# Patient Record
Sex: Male | Born: 1960 | Race: White | Hispanic: No | Marital: Married | Smoking: Never smoker
Health system: Southern US, Community
[De-identification: ages and names within clinical notes are randomized; demographics above are authoritative.]

## PROBLEM LIST (undated history)

## (undated) DIAGNOSIS — T7840XA Allergy, unspecified, initial encounter: Secondary | ICD-10-CM

## (undated) DIAGNOSIS — Z789 Other specified health status: Secondary | ICD-10-CM

## (undated) DIAGNOSIS — K859 Acute pancreatitis without necrosis or infection, unspecified: Secondary | ICD-10-CM

## (undated) DIAGNOSIS — K5792 Diverticulitis of intestine, part unspecified, without perforation or abscess without bleeding: Secondary | ICD-10-CM

## (undated) DIAGNOSIS — K760 Fatty (change of) liver, not elsewhere classified: Secondary | ICD-10-CM

## (undated) DIAGNOSIS — Z923 Personal history of irradiation: Secondary | ICD-10-CM

## (undated) HISTORY — PX: COLONOSCOPY: SHX174

---

## 2008-07-24 ENCOUNTER — Encounter: Admission: RE | Admit: 2008-07-24 | Discharge: 2008-07-24 | Payer: Self-pay | Admitting: Gastroenterology

## 2013-09-24 ENCOUNTER — Ambulatory Visit
Admission: RE | Admit: 2013-09-24 | Discharge: 2013-09-24 | Disposition: A | Discharge status: Home / Self Care | Payer: BC Managed Care – PPO | Source: Ambulatory Visit | Attending: Family Medicine | Admitting: Family Medicine

## 2013-09-24 ENCOUNTER — Other Ambulatory Visit: Payer: Self-pay | Admitting: Family Medicine

## 2013-09-24 DIAGNOSIS — R109 Unspecified abdominal pain: Secondary | ICD-10-CM

## 2013-09-24 MED ORDER — IOHEXOL 300 MG/ML  SOLN
100.0000 mL | Freq: Once | INTRAMUSCULAR | Status: AC | PRN
  Administered 2013-09-24: 100 mL via INTRAVENOUS

## 2018-08-21 ENCOUNTER — Encounter (HOSPITAL_BASED_OUTPATIENT_CLINIC_OR_DEPARTMENT_OTHER): Payer: Self-pay | Admitting: *Deleted

## 2018-08-25 ENCOUNTER — Ambulatory Visit (HOSPITAL_BASED_OUTPATIENT_CLINIC_OR_DEPARTMENT_OTHER): Admission: RE | Admit: 2018-08-25 | Payer: BC Managed Care – PPO | Source: Ambulatory Visit | Admitting: Otolaryngology

## 2018-08-25 HISTORY — DX: Other specified health status: Z78.9

## 2018-08-25 SURGERY — EXCISION, PAROTID GLAND
Anesthesia: General

## 2018-11-27 ENCOUNTER — Ambulatory Visit: Payer: Self-pay | Admitting: Otolaryngology

## 2018-11-27 ENCOUNTER — Other Ambulatory Visit: Payer: Self-pay

## 2018-11-27 ENCOUNTER — Encounter (HOSPITAL_BASED_OUTPATIENT_CLINIC_OR_DEPARTMENT_OTHER): Payer: Self-pay | Admitting: *Deleted

## 2018-11-27 NOTE — H&P (Signed)
PREOPERATIVE H&P  Chief Complaint: left parotid mass  HPI: Christopher Pacheco is a 58 y.o. male who presents for evaluation of left parotid mass he has had for over a year. This measures approximately 3 cm in size and located just behind the angle of the mandible in the region of the tail of the left parotid gland.. He has no palpable adenopathy in the neck He has normal facial nerve function.  Past Medical History:  Diagnosis Date  . Medical history non-contributory    Past Surgical History:  Procedure Laterality Date  . COLONOSCOPY     Social History   Socioeconomic History  . Marital status: Married    Spouse name: Not on file  . Number of children: Not on file  . Years of education: Not on file  . Highest education level: Not on file  Occupational History  . Not on file  Social Needs  . Financial resource strain: Not on file  . Food insecurity:    Worry: Not on file    Inability: Not on file  . Transportation needs:    Medical: Not on file    Non-medical: Not on file  Tobacco Use  . Smoking status: Never Smoker  . Smokeless tobacco: Never Used  Substance and Sexual Activity  . Alcohol use: Yes    Alcohol/week: 1.0 standard drinks    Types: 1 Glasses of wine per week    Comment: weekends  . Drug use: Never  . Sexual activity: Not on file  Lifestyle  . Physical activity:    Days per week: Not on file    Minutes per session: Not on file  . Stress: Not on file  Relationships  . Social connections:    Talks on phone: Not on file    Gets together: Not on file    Attends religious service: Not on file    Active member of club or organization: Not on file    Attends meetings of clubs or organizations: Not on file    Relationship status: Not on file  Other Topics Concern  . Not on file  Social History Narrative  . Not on file   History reviewed. No pertinent family history. No Known Allergies Prior to Admission medications   Medication Sig Start Date End Date  Taking? Authorizing Provider  ibuprofen (ADVIL,MOTRIN) 200 MG tablet Take 200 mg by mouth every 6 (six) hours as needed.    [provider]     Positive ROS: negative  All other systems have been reviewed and were otherwise negative with the exception of those mentioned in the HPI and as above.  Physical Exam: There were no vitals filed for this visit.  General: Alert, no acute distress Oral: Normal oral mucosa and tonsils Nasal: Clear nasal passages Neck: No palpable adenopathy or thyroid nodules. 3 cm mass just behind the angle of the mandible on the left side. Ear: Ear canal is clear with normal appearing TMs Cardiovascular: Regular rate and rhythm, no murmur.  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   Melony Overly, MD 11/27/2018 10:55 AM

## 2018-12-01 ENCOUNTER — Other Ambulatory Visit: Payer: Self-pay

## 2018-12-01 ENCOUNTER — Ambulatory Visit (HOSPITAL_BASED_OUTPATIENT_CLINIC_OR_DEPARTMENT_OTHER)
Admission: RE | Admit: 2018-12-01 | Discharge: 2018-12-02 | Disposition: A | Discharge status: Home / Self Care | Payer: BC Managed Care – PPO | Attending: Otolaryngology | Admitting: Otolaryngology

## 2018-12-01 ENCOUNTER — Ambulatory Visit (HOSPITAL_BASED_OUTPATIENT_CLINIC_OR_DEPARTMENT_OTHER): Payer: BC Managed Care – PPO | Admitting: Anesthesiology

## 2018-12-01 ENCOUNTER — Encounter (HOSPITAL_BASED_OUTPATIENT_CLINIC_OR_DEPARTMENT_OTHER): Payer: Self-pay | Admitting: Anesthesiology

## 2018-12-01 ENCOUNTER — Encounter (HOSPITAL_BASED_OUTPATIENT_CLINIC_OR_DEPARTMENT_OTHER)
Admission: RE | Disposition: A | Discharge status: Home / Self Care | Payer: Self-pay | Source: Home / Self Care | Attending: Otolaryngology

## 2018-12-01 DIAGNOSIS — K118 Other diseases of salivary glands: Secondary | ICD-10-CM | POA: Diagnosis present

## 2018-12-01 DIAGNOSIS — C07 Malignant neoplasm of parotid gland: Secondary | ICD-10-CM | POA: Insufficient documentation

## 2018-12-01 DIAGNOSIS — D490 Neoplasm of unspecified behavior of digestive system: Secondary | ICD-10-CM | POA: Diagnosis present

## 2018-12-01 HISTORY — PX: PAROTIDECTOMY: SHX2163

## 2018-12-01 SURGERY — EXCISION, PAROTID GLAND
Anesthesia: General | Site: Neck | Laterality: Left

## 2018-12-01 MED ORDER — FENTANYL CITRATE (PF) 100 MCG/2ML IJ SOLN
INTRAMUSCULAR | Status: AC
  Filled 2018-12-01: qty 2

## 2018-12-01 MED ORDER — LACTATED RINGERS IV SOLN
INTRAVENOUS | Status: DC
  Administered 2018-12-01 (×3): via INTRAVENOUS

## 2018-12-01 MED ORDER — IBUPROFEN 100 MG/5ML PO SUSP
ORAL | Status: AC
  Filled 2018-12-01: qty 20

## 2018-12-01 MED ORDER — CEFAZOLIN SODIUM-DEXTROSE 1-4 GM/50ML-% IV SOLN
1.0000 g | Freq: Three times a day (TID) | INTRAVENOUS | Status: DC
  Administered 2018-12-01 – 2018-12-02 (×3): 1 g via INTRAVENOUS
  Filled 2018-12-01 (×3): qty 50

## 2018-12-01 MED ORDER — ONDANSETRON HCL 4 MG PO TABS: 4.0000 mg | ORAL_TABLET | ORAL | Status: DC | PRN

## 2018-12-01 MED ORDER — POTASSIUM CHLORIDE IN NACL 20-0.9 MEQ/L-% IV SOLN
INTRAVENOUS | Status: DC
  Administered 2018-12-01: 13:00:00 via INTRAVENOUS
  Filled 2018-12-01 (×2): qty 1000

## 2018-12-01 MED ORDER — CHLORHEXIDINE GLUCONATE CLOTH 2 % EX PADS: 6.0000 | MEDICATED_PAD | Freq: Once | CUTANEOUS | Status: DC

## 2018-12-01 MED ORDER — DEXAMETHASONE SODIUM PHOSPHATE 4 MG/ML IJ SOLN
INTRAMUSCULAR | Status: DC | PRN
  Administered 2018-12-01: 10 mg via INTRAVENOUS

## 2018-12-01 MED ORDER — DEXAMETHASONE SODIUM PHOSPHATE 10 MG/ML IJ SOLN
INTRAMUSCULAR | Status: AC
  Filled 2018-12-01: qty 1

## 2018-12-01 MED ORDER — ONDANSETRON HCL 4 MG/2ML IJ SOLN: 4.0000 mg | INTRAMUSCULAR | Status: DC | PRN

## 2018-12-01 MED ORDER — CEFAZOLIN SODIUM-DEXTROSE 2-4 GM/100ML-% IV SOLN
INTRAVENOUS | Status: AC
  Filled 2018-12-01: qty 100

## 2018-12-01 MED ORDER — BACITRACIN 500 UNIT/GM EX OINT
TOPICAL_OINTMENT | CUTANEOUS | Status: DC | PRN
  Administered 2018-12-01: 1 via TOPICAL

## 2018-12-01 MED ORDER — LIDOCAINE-EPINEPHRINE 1 %-1:100000 IJ SOLN
INTRAMUSCULAR | Status: AC
  Filled 2018-12-01: qty 1

## 2018-12-01 MED ORDER — SUCCINYLCHOLINE CHLORIDE 20 MG/ML IJ SOLN
INTRAMUSCULAR | Status: DC | PRN
Start: 2018-12-01 — End: 2018-12-01
  Administered 2018-12-01: 140 mg via INTRAVENOUS

## 2018-12-01 MED ORDER — DEXAMETHASONE SODIUM PHOSPHATE 10 MG/ML IJ SOLN
10.0000 mg | Freq: Once | INTRAMUSCULAR | Status: AC
  Administered 2018-12-01: 10 mg via INTRAVENOUS

## 2018-12-01 MED ORDER — PROPOFOL 500 MG/50ML IV EMUL
INTRAVENOUS | Status: AC
  Filled 2018-12-01: qty 50

## 2018-12-01 MED ORDER — MIDAZOLAM HCL 2 MG/2ML IJ SOLN
INTRAMUSCULAR | Status: AC
  Filled 2018-12-01: qty 2

## 2018-12-01 MED ORDER — LIDOCAINE-EPINEPHRINE 1 %-1:100000 IJ SOLN
INTRAMUSCULAR | Status: DC | PRN
  Administered 2018-12-01: 3 mL

## 2018-12-01 MED ORDER — SCOPOLAMINE 1 MG/3DAYS TD PT72: 1.0000 | MEDICATED_PATCH | Freq: Once | TRANSDERMAL | Status: DC | PRN

## 2018-12-01 MED ORDER — EPINEPHRINE 30 MG/30ML IJ SOLN
INTRAMUSCULAR | Status: AC
  Filled 2018-12-01: qty 1

## 2018-12-01 MED ORDER — LIDOCAINE 2% (20 MG/ML) 5 ML SYRINGE
INTRAMUSCULAR | Status: DC | PRN
  Administered 2018-12-01: 100 mg via INTRAVENOUS

## 2018-12-01 MED ORDER — MIDAZOLAM HCL 2 MG/2ML IJ SOLN
1.0000 mg | INTRAMUSCULAR | Status: DC | PRN
  Administered 2018-12-01: 2 mg via INTRAVENOUS

## 2018-12-01 MED ORDER — IBUPROFEN 100 MG/5ML PO SUSP
400.0000 mg | Freq: Four times a day (QID) | ORAL | Status: DC | PRN
  Administered 2018-12-01 (×2): 400 mg via ORAL

## 2018-12-01 MED ORDER — MORPHINE SULFATE (PF) 4 MG/ML IV SOLN: 2.0000 mg | INTRAVENOUS | Status: DC | PRN

## 2018-12-01 MED ORDER — EPHEDRINE SULFATE 50 MG/ML IJ SOLN
INTRAMUSCULAR | Status: DC | PRN
  Administered 2018-12-01 (×5): 5 mg via INTRAVENOUS

## 2018-12-01 MED ORDER — FENTANYL CITRATE (PF) 100 MCG/2ML IJ SOLN
50.0000 ug | INTRAMUSCULAR | Status: DC | PRN
  Administered 2018-12-01: 100 ug via INTRAVENOUS
  Administered 2018-12-01: 50 ug via INTRAVENOUS

## 2018-12-01 MED ORDER — PROPOFOL 10 MG/ML IV BOLUS
INTRAVENOUS | Status: DC | PRN
Start: 2018-12-01 — End: 2018-12-01
  Administered 2018-12-01: 160 mg via INTRAVENOUS
  Administered 2018-12-01: 20 mg via INTRAVENOUS

## 2018-12-01 MED ORDER — ONDANSETRON HCL 4 MG/2ML IJ SOLN
INTRAMUSCULAR | Status: AC
  Filled 2018-12-01: qty 2

## 2018-12-01 MED ORDER — LIDOCAINE 2% (20 MG/ML) 5 ML SYRINGE
INTRAMUSCULAR | Status: AC
  Filled 2018-12-01: qty 5

## 2018-12-01 MED ORDER — ONDANSETRON HCL 4 MG/2ML IJ SOLN
INTRAMUSCULAR | Status: DC | PRN
  Administered 2018-12-01: 4 mg via INTRAVENOUS

## 2018-12-01 MED ORDER — FENTANYL CITRATE (PF) 100 MCG/2ML IJ SOLN: 25.0000 ug | INTRAMUSCULAR | Status: DC | PRN

## 2018-12-01 MED ORDER — HYDROCODONE-ACETAMINOPHEN 5-325 MG PO TABS
1.0000 | ORAL_TABLET | ORAL | Status: DC | PRN
  Administered 2018-12-01 (×2): 2 via ORAL
  Filled 2018-12-01 (×2): qty 2

## 2018-12-01 MED ORDER — BACITRACIN ZINC 500 UNIT/GM EX OINT
TOPICAL_OINTMENT | CUTANEOUS | Status: AC
  Filled 2018-12-01: qty 0.9

## 2018-12-01 MED ORDER — CEFAZOLIN SODIUM-DEXTROSE 2-4 GM/100ML-% IV SOLN
2.0000 g | INTRAVENOUS | Status: AC
  Administered 2018-12-01: 2 g via INTRAVENOUS

## 2018-12-01 SURGICAL SUPPLY — 78 items
APPLICATOR COTTON TIP 6 STRL (MISCELLANEOUS) ×1 IMPLANT
APPLICATOR COTTON TIP 6IN STRL (MISCELLANEOUS) ×3
ATTRACTOMAT 16X20 MAGNETIC DRP (DRAPES) ×3 IMPLANT
BENZOIN TINCTURE PRP APPL 2/3 (GAUZE/BANDAGES/DRESSINGS) IMPLANT
BLADE SURG 12 STRL SS (BLADE) ×3 IMPLANT
BLADE SURG 15 STRL LF DISP TIS (BLADE) ×1 IMPLANT
BLADE SURG 15 STRL SS (BLADE) ×2
CANISTER SUCT 1200ML W/VALVE (MISCELLANEOUS) ×3 IMPLANT
CLEANER CAUTERY TIP 5X5 PAD (MISCELLANEOUS) IMPLANT
CLOSURE WOUND 1/2 X4 (GAUZE/BANDAGES/DRESSINGS)
CORD BIPOLAR FORCEPS 12FT (ELECTRODE) ×3 IMPLANT
COTTONBALL LRG STERILE PKG (GAUZE/BANDAGES/DRESSINGS) ×3 IMPLANT
COVER BACK TABLE 60X90IN (DRAPES) ×3 IMPLANT
COVER MAYO STAND STRL (DRAPES) ×3 IMPLANT
COVER WAND RF STERILE (DRAPES) IMPLANT
DECANTER SPIKE VIAL GLASS SM (MISCELLANEOUS) ×3 IMPLANT
DERMABOND ADVANCED (GAUZE/BANDAGES/DRESSINGS)
DERMABOND ADVANCED .7 DNX12 (GAUZE/BANDAGES/DRESSINGS) IMPLANT
DRAIN JP 10F RND SILICONE (MISCELLANEOUS) ×2 IMPLANT
DRAIN PENROSE 1/4X12 LTX STRL (WOUND CARE) IMPLANT
DRAPE SURG 17X23 STRL (DRAPES) ×3 IMPLANT
DRAPE U-SHAPE 76X120 STRL (DRAPES) ×3 IMPLANT
ELECT COATED BLADE 2.86 ST (ELECTRODE) ×3 IMPLANT
ELECT REM PT RETURN 9FT ADLT (ELECTROSURGICAL) ×3
ELECTRODE REM PT RTRN 9FT ADLT (ELECTROSURGICAL) ×1 IMPLANT
EVACUATOR SILICONE 100CC (DRAIN) ×2 IMPLANT
FORCEPS BIPOLAR SPETZLER 8 1.0 (NEUROSURGERY SUPPLIES) ×3 IMPLANT
GAUZE 4X4 16PLY RFD (DISPOSABLE) IMPLANT
GAUZE SPONGE 4X4 12PLY STRL LF (GAUZE/BANDAGES/DRESSINGS) IMPLANT
GLOVE BIO SURGEON STRL SZ 6.5 (GLOVE) ×2 IMPLANT
GLOVE BIO SURGEONS STRL SZ 6.5 (GLOVE) ×2
GLOVE BIOGEL PI IND STRL 7.0 (GLOVE) IMPLANT
GLOVE BIOGEL PI INDICATOR 7.0 (GLOVE) ×6
GLOVE SS BIOGEL STRL SZ 7.5 (GLOVE) ×1 IMPLANT
GLOVE SUPERSENSE BIOGEL SZ 7.5 (GLOVE) ×2
GLOVE SURG SS PI 7.5 STRL IVOR (GLOVE) IMPLANT
GOWN STRL REUS W/ TWL LRG LVL3 (GOWN DISPOSABLE) ×1 IMPLANT
GOWN STRL REUS W/ TWL XL LVL3 (GOWN DISPOSABLE) ×1 IMPLANT
GOWN STRL REUS W/TWL LRG LVL3 (GOWN DISPOSABLE) ×4
GOWN STRL REUS W/TWL XL LVL3 (GOWN DISPOSABLE) ×4
LOCATOR NERVE 3 VOLT (DISPOSABLE) IMPLANT
NDL HYPO 25X1 1.5 SAFETY (NEEDLE) ×1 IMPLANT
NEEDLE HYPO 25X1 1.5 SAFETY (NEEDLE) ×3 IMPLANT
NS IRRIG 1000ML POUR BTL (IV SOLUTION) ×3 IMPLANT
PACK BASIN DAY SURGERY FS (CUSTOM PROCEDURE TRAY) ×3 IMPLANT
PAD CLEANER CAUTERY TIP 5X5 (MISCELLANEOUS) ×2
PENCIL BUTTON HOLSTER BLD 10FT (ELECTRODE) ×3 IMPLANT
PIN SAFETY STERILE (MISCELLANEOUS) IMPLANT
SHEET MEDIUM DRAPE 40X70 STRL (DRAPES) ×2 IMPLANT
SLEEVE SCD COMPRESS KNEE MED (MISCELLANEOUS) ×3 IMPLANT
SPONGE INTESTINAL PEANUT (DISPOSABLE) IMPLANT
STAPLER VISISTAT 35W (STAPLE) IMPLANT
STRIP CLOSURE SKIN 1/2X4 (GAUZE/BANDAGES/DRESSINGS) IMPLANT
SUCTION FRAZIER HANDLE 10FR (MISCELLANEOUS)
SUCTION TUBE FRAZIER 10FR DISP (MISCELLANEOUS) IMPLANT
SUT CHROMIC 3 0 PS 2 (SUTURE) ×3 IMPLANT
SUT ETHILON 3 0 PS 1 (SUTURE) ×3 IMPLANT
SUT ETHILON 4 0 PS 2 18 (SUTURE) IMPLANT
SUT ETHILON 5 0 P 3 18 (SUTURE) ×2
SUT ETHILON 6 0 P 1 (SUTURE) IMPLANT
SUT NYLON ETHILON 5-0 P-3 1X18 (SUTURE) IMPLANT
SUT SILK 2 0 PERMA HAND 18 BK (SUTURE) ×3 IMPLANT
SUT SILK 2 0 SH (SUTURE) ×2 IMPLANT
SUT SILK 2 0 TIES 17X18 (SUTURE) ×2
SUT SILK 2-0 18XBRD TIE BLK (SUTURE) IMPLANT
SUT SILK 3 0 PS 1 (SUTURE) IMPLANT
SUT SILK 3 0 SH 30 (SUTURE) IMPLANT
SUT SILK 3 0 TIES 17X18 (SUTURE) ×2
SUT SILK 3-0 18XBRD TIE BLK (SUTURE) ×1 IMPLANT
SUT SILK 4 0 TIES 17X18 (SUTURE) ×3 IMPLANT
SWAB COLLECTION DEVICE MRSA (MISCELLANEOUS) IMPLANT
SWAB CULTURE ESWAB REG 1ML (MISCELLANEOUS) IMPLANT
SYR BULB 3OZ (MISCELLANEOUS) ×3 IMPLANT
SYR CONTROL 10ML LL (SYRINGE) ×3 IMPLANT
TOWEL GREEN STERILE FF (TOWEL DISPOSABLE) ×6 IMPLANT
TRAY DSU PREP LF (CUSTOM PROCEDURE TRAY) ×3 IMPLANT
TUBE CONNECTING 20'X1/4 (TUBING) ×1
TUBE CONNECTING 20X1/4 (TUBING) ×2 IMPLANT

## 2018-12-01 NOTE — Anesthesia Procedure Notes (Signed)
Procedure Name: Intubation Date/Time: 12/01/2018 7:47 AM Performed by: Maryella Shivers, CRNA Pre-anesthesia Checklist: Patient identified, Emergency Drugs available, Suction available and Patient being monitored Patient Re-evaluated:Patient Re-evaluated prior to induction Oxygen Delivery Method: Circle system utilized Preoxygenation: Pre-oxygenation with 100% oxygen Induction Type: IV induction Ventilation: Mask ventilation without difficulty Laryngoscope Size: Mac and 4 Tube type: Oral Tube size: 8.0 mm Number of attempts: 1 Airway Equipment and Method: Stylet and Oral airway Placement Confirmation: ETT inserted through vocal cords under direct vision,  positive ETCO2 and breath sounds checked- equal and bilateral Secured at: 22 cm Tube secured with: Tape Dental Injury: Teeth and Oropharynx as per pre-operative assessment

## 2018-12-01 NOTE — Anesthesia Postprocedure Evaluation (Signed)
Anesthesia Post Note  Patient: Christopher Pacheco  Procedure(s) Performed: PAROTIDECTOMY (Left Neck)     Patient location during evaluation: PACU Anesthesia Type: General Level of consciousness: awake Pain management: pain level controlled Vital Signs Assessment: post-procedure vital signs reviewed and stable Respiratory status: spontaneous breathing Cardiovascular status: stable Postop Assessment: no apparent nausea or vomiting Anesthetic complications: no    Last Vitals:  Vitals:   12/01/18 1212 12/01/18 1215  BP: 114/74 117/70  Pulse: (!) 110 (!) 109  Resp: 18 19  Temp: 36.5 C   SpO2: 100% 100%    Last Pain:  Vitals:   12/01/18 1212  TempSrc:   PainSc: 0-No pain                 Samon Dishner

## 2018-12-01 NOTE — Anesthesia Preprocedure Evaluation (Signed)
Anesthesia Evaluation  Patient identified by MRN, date of birth, ID band Patient awake  General Assessment Comment:History noted. CG  Reviewed: Allergy & Precautions, NPO status , Patient's Chart, lab work & pertinent test results  Airway Mallampati: II  TM Distance: >3 FB     Dental   Pulmonary neg pulmonary ROS,    breath sounds clear to auscultation       Cardiovascular negative cardio ROS   Rhythm:Regular Rate:Normal     Neuro/Psych    GI/Hepatic negative GI ROS, Neg liver ROS,   Endo/Other  negative endocrine ROS  Renal/GU negative Renal ROS     Musculoskeletal   Abdominal   Peds  Hematology   Anesthesia Other Findings   Reproductive/Obstetrics                             Anesthesia Physical Anesthesia Plan  ASA: II  Anesthesia Plan: General   Post-op Pain Management:    Induction: Intravenous  PONV Risk Score and Plan: Ondansetron, Dexamethasone and Midazolam  Airway Management Planned: Oral ETT  Additional Equipment:   Intra-op Plan:   Post-operative Plan: Extubation in OR  Informed Consent: I have reviewed the patients History and Physical, chart, labs and discussed the procedure including the risks, benefits and alternatives for the proposed anesthesia with the patient or authorized representative who has indicated his/her understanding and acceptance.     Dental advisory given  Plan Discussed with: CRNA and Anesthesiologist  Anesthesia Plan Comments:         Anesthesia Quick Evaluation

## 2018-12-01 NOTE — Op Note (Signed)
NAME: Christopher Pacheco, Christopher Pacheco MEDICAL RECORD [id number] ACCOUNT [id number] DATE OF BIRTH:1961-08-11 FACILITY: [facility] LOCATION: [facility] PHYSICIAN:CHRISTOPHER Lincoln Maxin, MD  OPERATIVE REPORT  DATE OF PROCEDURE:  12/01/2018  PREOPERATIVE DIAGNOSIS:  Left parotid tumor.  POSTOPERATIVE DIAGNOSIS:  Left parotid tumor.  OPERATION PERFORMED:  Left parotidectomy with facial nerve dissection with removal of a deep lobe left parotid tumor.  SURGEON:  Melony Overly, MD  ANESTHESIA:  General endotracheal.  ESTIMATED BLOOD LOSS:  50 mL.  COMPLICATIONS:  None.  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  It has not been bothersome to him.  He has normal facial nerve function.  He is taken to the operating room at this time for  left superficial parotidectomy with facial nerve dissection and removal of left parotid mass.  Preoperatively, the mass is located just inferior to the angle of the mandible on the left side.  DESCRIPTION OF PROCEDURE:  After adequate endotracheal anesthesia, the patient received 2 grams Ancef IV preoperatively.  Left face was prepped with Betadine solution and draped out with sterile towels.  A standard parotid incision was made.   Subplatysmal flaps were elevated tissue was elevated off the parotid fascia anteriorly and posteriorly.  Elevation was down to the sternocleidomastoid muscle.  The mass on palpation was a little bit anterior and the extended up onto the mandible  anteriorly inferiorly.  It was smooth to palpation and fairly soft, but was deep.  Dissection was carried down next to the cartilage of the ear down to where the facial nerve trunk was identified in its normal anatomical position just inferior to the  styloid process.  The trunk was discovered, dissected out.  Being careful not to preserve all the branches.  The mass was anterior and inferior and only the lower main  division of the facial nerve was followed after it divided.  The lower branches of the  facial nerve was then dissected out and the mass was actually just deep to the lower branches of the facial nerve.  There were a couple of large veins, a branch of the facial vein that were ligated with 4-0 silk sutures and divided.  The mass extended  very anterior on the parotid gland and just behind the  mass which was deep to the parotid gland.  It extended what appeared to in the direction of the parotid duct anteriorly.  took meticulous dissection now below the lower branches of the facial nerve  to dissect the tumor out.  It was encapsulated, but part of the capsule was kind of adherent to the surrounding tissue requiring division with bipolar cautery.  Several ligatures were used for veins that fed the mass.  At the very distal end, where it  appeared to be adjacent to the parotid duct.  The entire firm mass was removed and a clamp was placed around distal to this along with what I think was the parotid duct and this was ligated with a 2-0 silk suture.  The mass was dissected off of the lower  branches of the facial nerve and off of the masseter muscle.  Hemostasis was obtained with bipolar cautery.  Mass was sent to pathology.  The lower branches of the facial nerve were preserved.  After obtaining adequate hemostasis, a 10 French parotid  drain was brought out behind the ear.  Wound was irrigated with saline.  The wound was then closed  with 3-0 chromic suture subcutaneously and 5-0 nylon to reapproximate the skin edges.  Mupirocin ointment and a dressing was applied.  The patient was  awoken from anesthesia and transferred to recovery room  postoperatively doing well.  DISPOSITION:  The patient will be observed overnight in the recovery care center.  I plan on removing the JP drain and discharging the patient in the morning.  AN/NUANCE  D:12/01/2018 T:12/01/2018 JOB:[id number]

## 2018-12-01 NOTE — Brief Op Note (Signed)
12/01/2018  12:02 PM  PATIENT:  Christopher Pacheco  58 y.o. male  PRE-OPERATIVE DIAGNOSIS:  NEOPLASMA UNCERTAIN BEHAVIOR OF PAROTID GLAND  POST-OPERATIVE DIAGNOSIS:  NEOPLASMA UNCERTAIN BEHAVIOR OF PAROTID  LEFT  PROCEDURE:  Procedure(s): PAROTIDECTOMY (Left)  SURGEON:  Surgeon(s) and Role:    [name], MD - Primary  PHYSICIAN ASSISTANT:   ASSISTANTS: none   ANESTHESIA:   general  EBL:  40 mL   BLOOD ADMINISTERED:none  DRAINS: (10 Fr) Jackson-Pratt drain(s) with closed bulb suction in the left parotid   LOCAL MEDICATIONS USED:  XYLOCAINE   SPECIMEN:  Source of Specimen:  left parotid gland  DISPOSITION OF SPECIMEN:  PATHOLOGY  COUNTS:  YES  TOURNIQUET:  * No tourniquets in log *  DICTATION: .Other Dictation: Dictation Number [id number]  PLAN OF CARE: Admit for overnight observation  PATIENT DISPOSITION:  PACU - hemodynamically stable.   Delay start of Pharmacological VTE agent (>24hrs) due to surgical blood loss or risk of bleeding: yes

## 2018-12-01 NOTE — Transfer of Care (Signed)
Immediate Anesthesia Transfer of Care Note  Patient: Christopher Pacheco  Procedure(s) Performed: PAROTIDECTOMY (Left Neck)  Patient Location: PACU  Anesthesia Type:General  Level of Consciousness: sedated  Airway & Oxygen Therapy: Patient Spontanous Breathing and Patient connected to face mask oxygen  Post-op Assessment: Report given to RN and Post -op Vital signs reviewed and stable  Post vital signs: Reviewed and stable  Last Vitals:  Vitals Value Taken Time  BP 117/70 12/01/2018 12:15 PM  Temp    Pulse 109 12/01/2018 12:15 PM  Resp 19 12/01/2018 12:15 PM  SpO2 100 % 12/01/2018 12:15 PM  Vitals shown include unvalidated device data.  Last Pain:  Vitals:   12/01/18 0642  TempSrc: Oral  PainSc: 0-No pain      Patients Stated Pain Goal: 0 (14/43/15 4008)  Complications: No apparent anesthesia complications

## 2018-12-01 NOTE — Progress Notes (Signed)
Post op check Moderate facial weakness Patient is able to close eye. No significant swelling JP with serosanguinous output. Stable post op check with moderate facial nerve weakness. Gave 10 mg decadron IV Will plan D/C in am.

## 2018-12-01 NOTE — Interval H&P Note (Signed)
History and Physical Interval Note:  12/01/2018 7:43 AM  Christopher Pacheco  has presented today for surgery, with the diagnosis of NEOPLASMA UNCERTAIN BEHAVIOR OF PAROTID GLAND,CHRONIC RHINITIS,DEVIATED SEPTUM  The various methods of treatment have been discussed with the patient and family. After consideration of risks, benefits and other options for treatment, the patient has consented to  Procedure(s): PAROTIDECTOMY (Left) as a surgical intervention .  The patient's history has been reviewed, patient examined, no change in status, stable for surgery.  I have reviewed the patient's chart and labs.  Questions were answered to the patient's satisfaction.     Melony Overly

## 2018-12-02 DIAGNOSIS — C07 Malignant neoplasm of parotid gland: Secondary | ICD-10-CM | POA: Diagnosis not present

## 2018-12-02 MED ORDER — HYDROCODONE-ACETAMINOPHEN 5-325 MG PO TABS: 1.0000 | ORAL_TABLET | Freq: Four times a day (QID) | ORAL | 0 refills | Status: DC | PRN

## 2018-12-02 NOTE — Discharge Instructions (Signed)
May get incision site wet in 24 hrs. Apply antibiotic ointment to incision site daily. Tylenol, ibuprofen or hydrocodone tabs 1-2 every 6 hrs prn pain Return to Dr Pollie Friar office next Thursday at 4:30 to have sutures removed and review path report. Call office if any problems or questions    [phone]

## 2018-12-02 NOTE — Progress Notes (Signed)
POD 1 AF VSS Minimal complaints of pain Marked facial nerve weakness but has some mobility and able to close eye Minimal facial swelling JP with serosanguinous output was removed and excision site was redressed and antibiotic ointment applied Path pending Patient discharged home and will follow up in 5 days Meds: home meds and hydrocodone 5 mg prn

## 2018-12-02 NOTE — Discharge Summary (Signed)
NAME: Christopher Pacheco, Christopher Pacheco MEDICAL RECORD [id number] ACCOUNT [id number] DATE OF BIRTH:1961-05-11 FACILITY: [facility] LOCATION: [facility] PHYSICIAN:CHRISTOPHER Lincoln Maxin, MD  DISCHARGE SUMMARY  DATE OF DISCHARGE:  12/02/2018  DIAGNOSIS:  Left parotid tumor.  OPERATIONS DURING THIS HOSPITALIZATION:  Left parotidectomy with facial nerve dissection on 12/01/2018.  HOSPITAL COURSE:  The patient was admitted via the operating room on 12/01/2018 at which time he underwent a left parotidectomy with facial nerve dissection.  The patient had a tumor or mass deep to the left facial nerve within the deep lobe of the  parotid gland that extended far anteriorly.  The nerve was preserved throughout the dissection, and the mass was sent to pathology, which is still pending.  Postoperatively, patient received perioperative Ancef and also had a JP drain in.   Postoperatively, he had marked facial weakness on the left side but was having some facial motion and was able to close the eye.  JP drain was removed on the first postoperative day.  The wound was doing well with minimal swelling.  The wound was  redressed, and the patient was discharged home on postop day #1.  He was instructed to take Tylenol, ibuprofen or hydrocodone p.r.n. pain.  He will follow up in my office in 5 days to have the sutures removed.  LN/NUANCE D:12/02/2018 T:12/02/2018 JOB:[id number]

## 2018-12-04 ENCOUNTER — Encounter (HOSPITAL_BASED_OUTPATIENT_CLINIC_OR_DEPARTMENT_OTHER): Payer: Self-pay | Admitting: Otolaryngology

## 2018-12-18 ENCOUNTER — Other Ambulatory Visit (HOSPITAL_COMMUNITY): Payer: Self-pay | Admitting: Otolaryngology

## 2018-12-18 DIAGNOSIS — C07 Malignant neoplasm of parotid gland: Secondary | ICD-10-CM

## 2018-12-20 ENCOUNTER — Other Ambulatory Visit (HOSPITAL_COMMUNITY): Payer: Self-pay | Admitting: Otolaryngology

## 2018-12-20 DIAGNOSIS — C07 Malignant neoplasm of parotid gland: Secondary | ICD-10-CM

## 2018-12-21 ENCOUNTER — Ambulatory Visit (HOSPITAL_COMMUNITY): Admission: RE | Admit: 2018-12-21 | Payer: BC Managed Care – PPO | Source: Ambulatory Visit

## 2018-12-21 ENCOUNTER — Encounter (HOSPITAL_COMMUNITY): Payer: Self-pay

## 2018-12-21 ENCOUNTER — Ambulatory Visit (HOSPITAL_COMMUNITY)
Admission: RE | Admit: 2018-12-21 | Discharge: 2018-12-21 | Disposition: A | Discharge status: Home / Self Care | Payer: BC Managed Care – PPO | Source: Ambulatory Visit | Attending: Otolaryngology | Admitting: Otolaryngology

## 2018-12-21 DIAGNOSIS — C07 Malignant neoplasm of parotid gland: Secondary | ICD-10-CM | POA: Diagnosis not present

## 2018-12-21 MED ORDER — IOHEXOL 300 MG/ML  SOLN
100.0000 mL | Freq: Once | INTRAMUSCULAR | Status: AC | PRN
  Administered 2018-12-21: 100 mL via INTRAVENOUS

## 2018-12-21 MED ORDER — SODIUM CHLORIDE (PF) 0.9 % IJ SOLN
INTRAMUSCULAR | Status: AC
  Filled 2018-12-21: qty 50

## 2018-12-22 ENCOUNTER — Other Ambulatory Visit (HOSPITAL_COMMUNITY): Payer: BC Managed Care – PPO

## 2018-12-22 ENCOUNTER — Ambulatory Visit (HOSPITAL_COMMUNITY): Admission: RE | Admit: 2018-12-22 | Payer: BC Managed Care – PPO | Source: Ambulatory Visit

## 2018-12-29 ENCOUNTER — Telehealth: Payer: Self-pay | Admitting: *Deleted

## 2018-12-29 NOTE — Telephone Encounter (Signed)
Oncology Nurse Navigator Documentation  Placed navigation introductory call, LVMM requesting call-back.  Gayleen Orem, RN, BSN Head & Neck Oncology Nurse [place] at [place] [phone]

## 2018-12-29 NOTE — Telephone Encounter (Signed)
Oncology Nurse Navigator Documentation  Rec'd call-back from new referral patient Christopher Pacheco.  Introduced myself as the H&N oncology nurse navigator that works with Dr. Isidore Moos to whom he has been referred by Dr. [name].   He confirmed understanding of referral and appt date/time of 2/25 8:30 NE/9:00 consult with Dr. Isidore Moos.  Briefly explained my role as his navigator, indicated I would be joining him during appt next week.  Confirmed understanding of [place] location, explained arrival and RadOnc registration process.  I explained the purpose of a dental evaluation prior to starting RT, indicated he wd be contacted by Dental Medicine  to arrange an appt within a day or so of his appt with Dr. Isidore Moos.   Answered his questions re: RT.  He indicated he and wife have planned and early April trip to [place].  I explained importance of not delaying start of tmt and proceeding with interruption.  Provided my contact information, encouraged him/her to call with questions/concerns before next week.  He/she verbalized understanding of information provided, expressed appreciation for my call.  Navigator Needs Assessment . Employment status:  Teaches at [employer], has flexible hours. . Support system: Wife . Transportation:  Own car. Marland Kitchen PCP:  Dr. Lona Kettle, sees him every 1-2 years.  Indicated he is overall very healthy, non-smoker, drinks a couple of glasses of wine over the weekend.  Eats a plant-based diet, swims regularly. Marland Kitchen PCD:  Dr. Vivia Ewing who just retired, expects to continue with her partner.  No dentures/partials.  Wisdom teeth extracted years ago.  Has twice-yearly check-ups, cleanings.  Rates dental health good to excellent.    Gayleen Orem, RN, BSN Head & Neck Oncology Nurse [place] at [place] [phone]

## 2019-01-02 NOTE — Progress Notes (Signed)
Head and Neck Cancer Location of Tumor / Histology:  12/01/18 Diagnosis Parotid gland, Left - POLYMORPHOUS ADENOCARCINOMA, 3.2 CM. - MARGINS OF RESECTION ARE NOT INVOLVED. - ONE LYMPH NODE, NEGATIVE FOR CARCINOMA (0/1).  Patient presented with symptoms of: He presented with a left parotid mass that he had for over a year.   Biopsies of left parotid gland revealed: polymorphous adenocarcinoma.   Nutrition Status Yes No Comments  Weight changes? []  [x]    Swallowing concerns? []  [x]    PEG? []  [x]     Referrals Yes No Comments  Social Work? []  [x]    Dentistry? [x]  []  Dr. Enrique Sack 01/09/19  Swallowing therapy? []  [x]    Nutrition? []  [x]    Med/Onc? []  [x]     Safety Issues Yes No Comments  Prior radiation? []  [x]    Pacemaker/ICD? []  [x]    Possible current pregnancy? []  [x]    Is the patient on methotrexate? []  [x]     Tobacco/Marijuana/Snuff/ETOH use: He drinks alcohol on the weekends. He does not smoke.   Past/Anticipated interventions by otolaryngology, if any:  12/01/18 OPERATION PERFORMED:  Left parotidectomy with facial nerve dissection with removal of a deep lobe left parotid tumor. SURGEON:  Melony Overly, MD  Past/Anticipated interventions by medical oncology, if any:  Not scheduled.    Current Complaints / other details:    BP 118/76 (BP Location: Left Arm, Patient Position: Sitting)   Pulse 64   Temp 98.3 F (36.8 C) (Oral)   Resp 18   Ht 5\' 10"  (1.778 m)   Wt 163 lb 8 oz (74.2 kg)   SpO2 100%   BMI 23.46 kg/m    Wt Readings from Last 3 Encounters:  01/09/19 163 lb 8 oz (74.2 kg)  12/01/18 163 lb 5.8 oz (74.1 kg)

## 2019-01-03 ENCOUNTER — Telehealth (HOSPITAL_COMMUNITY): Payer: Self-pay

## 2019-01-03 NOTE — Telephone Encounter (Signed)
Called patient and left message on machine to return call to Dental Medicine to schedule New Outpatient Appointment. Christopher Pacheco

## 2019-01-09 ENCOUNTER — Encounter: Payer: Self-pay | Admitting: Radiation Oncology

## 2019-01-09 ENCOUNTER — Ambulatory Visit
Admission: RE | Admit: 2019-01-09 | Discharge: 2019-01-09 | Disposition: A | Discharge status: Home / Self Care | Payer: BC Managed Care – PPO | Source: Ambulatory Visit | Attending: Radiation Oncology | Admitting: Radiation Oncology

## 2019-01-09 ENCOUNTER — Ambulatory Visit (HOSPITAL_COMMUNITY): Payer: Self-pay | Admitting: Dentistry

## 2019-01-09 ENCOUNTER — Encounter (HOSPITAL_COMMUNITY): Payer: Self-pay | Admitting: Dentistry

## 2019-01-09 ENCOUNTER — Encounter: Payer: Self-pay | Admitting: *Deleted

## 2019-01-09 ENCOUNTER — Other Ambulatory Visit: Payer: Self-pay

## 2019-01-09 VITALS — BP 118/76 | HR 64 | Temp 98.3°F | Resp 18 | Ht 70.0 in | Wt 163.5 lb

## 2019-01-09 VITALS — BP 122/73 | HR 57 | Temp 97.8°F

## 2019-01-09 DIAGNOSIS — K0530 Chronic periodontitis, unspecified: Secondary | ICD-10-CM

## 2019-01-09 DIAGNOSIS — K030 Excessive attrition of teeth: Secondary | ICD-10-CM

## 2019-01-09 DIAGNOSIS — K011 Impacted teeth: Secondary | ICD-10-CM

## 2019-01-09 DIAGNOSIS — M2632 Excessive spacing of fully erupted teeth: Secondary | ICD-10-CM

## 2019-01-09 DIAGNOSIS — C07 Malignant neoplasm of parotid gland: Secondary | ICD-10-CM

## 2019-01-09 DIAGNOSIS — K036 Deposits [accretions] on teeth: Secondary | ICD-10-CM | POA: Diagnosis not present

## 2019-01-09 DIAGNOSIS — K0850 Unsatisfactory restoration of tooth, unspecified: Secondary | ICD-10-CM

## 2019-01-09 DIAGNOSIS — K06010 Localized gingival recession, unspecified: Secondary | ICD-10-CM

## 2019-01-09 DIAGNOSIS — M264 Malocclusion, unspecified: Secondary | ICD-10-CM

## 2019-01-09 DIAGNOSIS — Z01818 Encounter for other preprocedural examination: Secondary | ICD-10-CM

## 2019-01-09 DIAGNOSIS — M2624 Reverse articulation: Secondary | ICD-10-CM

## 2019-01-09 DIAGNOSIS — K08409 Partial loss of teeth, unspecified cause, unspecified class: Secondary | ICD-10-CM | POA: Diagnosis not present

## 2019-01-09 HISTORY — DX: Diverticulitis of intestine, part unspecified, without perforation or abscess without bleeding: K57.92

## 2019-01-09 HISTORY — DX: Fatty (change of) liver, not elsewhere classified: K76.0

## 2019-01-09 HISTORY — DX: Allergy, unspecified, initial encounter: T78.40XA

## 2019-01-09 HISTORY — DX: Acute pancreatitis without necrosis or infection, unspecified: K85.90

## 2019-01-09 MED ORDER — SODIUM FLUORIDE 1.1 % DT CREA: TOPICAL_CREAM | DENTAL | 99 refills | Status: DC

## 2019-01-09 NOTE — Patient Instructions (Signed)
RADIATION THERAPY AND DECISIONS REGARDING YOUR TEETH  Xerostomia (dry mouth) Your salivary glands may be in the filed of radiation.  Radiation may include all or part of your saliva glands.  This will cause your saliva to dry up and you will have a dry mouth.  The dry mouth will be for the rest of your life unless your radiation oncologist tells you otherwise.  Your saliva has many functions:  Saliva wets your tongue for speaking.  It coats your teeth and the inside of your mouth for easier movement.  It helps with chewing and swallowing food.  It helps clean away harmful acid and toxic products made by the germs in your mouth, therefore it helps prevent cavities.  It kills some germs in your mouth and helps to prevent gum disease.  It helps to carry flavor to your taste buds.  Once you have lost your saliva you will be at higher risk for tooth decay and gum disease.  What can be done to help improve your mouth when there's not enough saliva:  1.  Your dentist may give a prescription for Salagen.  It will not bring back all of your saliva but may bring back some of it.  Also your saliva may be thick and ropy or white and foamy. It will not feel like it use to feel.  2.  You will need to swish with water every time your mouth feels dry.  YOU CANNOT suck on any cough drops, mints, lemon drops, candy, vitamin C or any other products.  You cannot use anything other than water to make your mouth feel less dry.  If you want to drink anything else you have to drink it all at once and brush afterwards.  Be sure to discuss the details of your diet habits with your dentist or hygienist.  Radiation caries: This is decay that happens very quickly once your mouth is very dry due to radiation therapy.  Normally cavities take six months to two years to become a problem.  When you have dry mouth cavities may take as little as eight weeks to cause you a problem.  This is why dental check ups every two  months are necessary as long as you have a dry mouth. Radiation caries typically, but not always, start at your gum line where it is hard to see the cavity.  It is therefore also hard to fill these cavities adequately.  This high rate of cavities happens because your mouth no longer has saliva and therefore the acid made by the germs starts the decay process.  Whenever you eat anything the germs in your mouth change the food into acid.  The acid then burns a small hole in your tooth.  This small hole is the beginning of a cavity.  If this is not treated then it will grow bigger and become a cavity.  The way to avoid this hole getting bigger is to use fluoride every evening as prescribed by your dentist.  You have to make sure that your teeth are very clean before you use the fluoride.  This fluoride in turn will strengthen your teeth and prepare them for another day of fighting acid.  If you develop radiation caries many times the damage is so large that you will have to have all your teeth removed.  This could be a big problem if some of these teeth are in the field of radiation.  Further details of why this could be   a big problem will follow.  (See Osteoradionecrosis).  Loss of taste (dysgeusia) This happens to varying degrees once you've had radiation therapy to your jaw region.  Many times taste is not completely lost but becomes limited.  The loss of taste is mostly due to radiation affecting your taste buds.  However if you have no saliva in your mouth to carry the flavor to your taste buds it would be difficult for your taste buds to taste anything.  That is why using water or a prescription for Salagen prior to meals and during meals may help with some of the taste.  Keep in mind that taste generally returns very slowly over the course of several months or several years after radiation therapy.  Don't give up hope.  Trismus According to your Radiation Oncologist your TMJ or jaw joints are going to be  partially or fully in the field of radiation.  This means that over time the muscles that help you open and close your mouth may get stiff.  This will potentially result in your not being able to open your mouth wide enough or as wide as you can open it now.  Le me give you an example of how slowly this happens and how unaware people are of it.  A gentlemen that had radiation therapy two years ago came back to me complaining that bananas are just too large for him to be able to fit them in between his teeth.  He was not able to open wide enough to bite into a banana.  This happens slowly and over a period of time.  What do we do to try and prevent this?  Your dentist will probably give you a stack of sticks called a trismus exercise device .  This stack will help your remind your muscles and your jaw joint to open up to the same distance every day.  Use these sticks every morning when you wake up according to the instructions given by the dentist.   You must use these sticks for at least one to two years after radiation therapy.  The reason for that is because it happens so slowly and keeps going on for about two years after radiation therapy.  Your hospital dentist will help you monitor your mouth opening and make sure that it's not getting smaller.  Osteoradionecrosis (ORN) This is a condition where your jaw bone after having had radiation therapy becomes very dry.  It has very little blood supply to keep it alive.  If you develop a cavity that turns into an abscess or an infection then the jaw bone does not have enough blood supply to help fight the infection.  At this point it is very likely that the infection could cause the death of your jaw bone.  When you have dead bone it has to be removed.  Therefore you might end up having to have surgery to remove part of your jaw bone, the part of the jaw bone that has been affected.   Healing is also a problem if you are to have surgery in the areas where the bone  has had radiation therapy.  The same reasons apply.  If you have surgery you need more blood supply which is not available.  When blood supply and oxygen are not available again, there is a chance for the bone to die.  Occasionally ORN happens on its own with no obvious reason.  This is quite rare.  We believe that   patients who continue to smoke and/or drink alcohol have a higher chance of having this bone problem.  Therefore once your jaw bone has had radiation therapy if there are any teeth in that area, you should never have them pulled.  You should also never have any surgery on your teeth or gums in that area unless the oral surgeon or Periodontist is aware of your history of radiation. There is some expensive management techniques that might be used to limit your risks.  The risks for ORN either from infection or spontaneous ( or on it's own) are life long.    TRISMUS  Trismus is a condition where the jaw does not allow the mouth to open as wide as it usually does.  This can happen almost suddenly, or in other cases the process is so slow, it is hard to notice it-until it is too far along.  When the jaw joints and/or muscles have been exposed to radiation treatments, the onset of Trismus is very slow.  This is because the muscles are losing their stretching ability over a long period of time, as long as 2 YEARS after the end of radiation.  It is therefore important to exercise these muscles and joints.  TRISMUS EXERCISES   Stack of tongue depressors measuring the same or a little less than the last documented MIO (Maximum Interincisal Opening).  Secure them with a rubber band on both ends.  Place the stack in the patient's mouth, supporting the other end.  Allow 30 seconds for muscle stretching.  Rest for a few seconds.  Repeat 3-5 times  For all radiation patients, this exercise is recommended in the mornings and evenings unless otherwise instructed.  The exercise should be done for  a period of 2 YEARS after the end of radiation.  MIO should be checked routinely on recall dental visits by the general dentist or the hospital dentist.  The patient is advised to report any changes, soreness, or difficulties encountered when doing the exercises.   FLUORIDE TRAYS PATIENT INSTRUCTIONS    Obtain Prevident 5000 prescription from the pharmacy.  Don't be surprised if it needs to be ordered.  Be sure to let the pharmacy know when you are close to needing a new refill for them to have it ready for you without interruption of Fluoride use.  The best time to use your Fluoride is before bedtime.  You must brush your teeth very well and floss before using the Fluoride in order to get the best use out of the Fluoride treatments.  Place Fluoride gel in the tray and spread gel around in the tray with your finger or cotton tip applicator.  Place the tray on your lower teeth and your upper teeth.  Make sure the trays are seated all the way.  Remember, they only fit one way on your teeth.  Insert for 5 full minutes.  At the end of the 5 minutes, take the trays out.  SPIT OUT excess.   Do NOT rinse your mouth!  Do NOT eat or drink after treatments for at least 30 minutes.  This is why the best time for your treatments is before bedtime.  Clean the inside of your Fluoride trays using COLD WATER and a toothbrush.  In order to keep your Trays from discoloring and free from odors, soak them overnight in denture cleaners such as Efferdent.  Do not use bleach or non denture products.  Store the trays in a safe dry place AWAY   from any heat until your next treatment.  If anything happens to your Fluoride trays, or they don't fit as well after any dental work, please let us know as soon as possible.  

## 2019-01-09 NOTE — Progress Notes (Signed)
DENTAL CONSULTATION  Date of Consultation:  01/09/2019 Patient Name:   Christopher Pacheco Date of Birth:   24-Jul-1961 Medical Record Number: [id number]  VITALS: BP 122/73 (BP Location: Right Arm)   Pulse (!) 57   Temp 97.8 F (36.6 C)   CHIEF COMPLAINT: Patient referred by Dr. Isidore Moos for a dental consultation.  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 Patient is status post left parotidectomy with Dr. [name] on 12/01/2018. Patient with anticipated postoperative radiation therapy. Patient is now seen as part of a medically necessary preradiation therapy dental protocol examination.  The patient currently denies acute toothaches, swellings, or abscesses. Patient was last seen by a dentist in December of 2019 and early January of 2020 for completion of root canal therapy on tooth #29 in [place]. Patient denies having any problems with the tooth with the root canal therapy. Prior to that, the patient had been seen for an exam and cleaning with his primary dentist,Dr. Vivia Ewing, in October of 2019. The patient denies having any partial dentures.  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  PROBLEM LIST: Patient Active Problem List   Diagnosis Date Noted  . Parotid mass 12/01/2018    PMH: Past Medical History:  Diagnosis Date  . Allergy   . Diffuse infection of pancreas    30 years ago  . Diverticulitis   . Fatty liver    Dr. Watt Climes  . Medical history non-contributory     PSH: Past Surgical History:  Procedure Laterality Date  . COLONOSCOPY    . PAROTIDECTOMY Left 12/01/2018   Procedure: PAROTIDECTOMY;  Surgeon: [name], MD;  Location: [facility];  Service: ENT;  Laterality: Left;    ALLERGIES: No Known Allergies  MEDICATIONS: Current Outpatient Medications  Medication Sig Dispense Refill  . HYDROcodone-acetaminophen (NORCO/VICODIN) 5-325 MG tablet Take 1-2  tablets by mouth every 6 (six) hours as needed for moderate pain. (Patient not taking: Reported on 01/09/2019) 12 tablet 0  . ibuprofen (ADVIL,MOTRIN) 200 MG tablet Take 200 mg by mouth every 6 (six) hours as needed.     No current facility-administered medications for this visit.     LABS: No results found for: WBC, HGB, HCT, MCV, PLT No results found for: NA, K, CL, CO2, GLUCOSE, BUN, CREATININE, CALCIUM, GFRNONAA, GFRAA No results found for: INR, PROTIME No results found for: PTT  SOCIAL HISTORY: Social History   Socioeconomic History  . Marital status: Married    Spouse name: Not on file  . Number of children: Not on file  . Years of education: Not on file  . Highest education level: Not on file  Occupational History  . Not on file  Social Needs  . Financial resource strain: Not on file  . Food insecurity:    Worry: Not on file    Inability: Not on file  . Transportation needs:    Medical: No    Non-medical: No  Tobacco Use  . Smoking status: Never Smoker  . Smokeless tobacco: Never Used  Substance and Sexual Activity  . Alcohol use: Yes    Alcohol/week: 2.0 standard drinks    Types: 2 Glasses of wine per week    Comment: weekends  . Drug use: Never  . Sexual activity: Not on file  Lifestyle  . Physical activity:    Days per week: Not on file    Minutes per session: Not on file  .  Stress: Not on file  Relationships  . Social connections:    Talks on phone: Not on file    Gets together: Not on file    Attends religious service: Not on file    Active member of club or organization: Not on file    Attends meetings of clubs or organizations: Not on file    Relationship status: Not on file  . Intimate partner violence:    Fear of current or ex partner: No    Emotionally abused: No    Physically abused: No    Forced sexual activity: No  Other Topics Concern  . Not on file  Social History Narrative   Left-handed. Therapist, occupational professor at [employer]. As of  spring 2020, he is on research leave to work on a book regarding politics and cancer. He swims three days a week and plays the violin.    FAMILY HISTORY: History reviewed. No pertinent family history.  REVIEW OF SYSTEMS: Reviewed with the patient as per History of present illness. 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  DENTAL HISTORY: CHIEF COMPLAINT: Patient referred by Dr. Isidore Moos for a dental consultation.  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 Patient is status post left parotidectomy with Dr. [name] on 12/01/2018. Patient with anticipated postoperative radiation therapy. Patient is now seen as part of a medically necessary preradiation therapy dental protocol examination.  The patient currently denies acute toothaches, swellings, or abscesses. Patient was last seen by a dentist in December of 2019 and early January of 2020 for completion of root canal therapy on tooth #29 in [place]. Patient denies having any problems with the tooth with the root canal therapy. Prior to that, the patient had been seen for an exam and cleaning with his primary dentist,Dr. Vivia Ewing, in October of 2019. The patient denies having any partial dentures.  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  DENTAL EXAMINATION: GENERAL:  The patient is a well-developed, well-nourished male in no acute distress. HEAD AND NECK: the left neck is consistent with previous parotid surgery and left neck dissection. There is no right neck lymphadenopathy. The patient denies having acute TMJ symptoms. Patient has a maximum interincisal opening of 35 mm. INTRAORAL EXAM:  Patient has normal saliva. The patient has a deep palatal vault. There is maxillary mandibular anterior incisal attrition. DENTITION:  Patient is missing tooth numbers 1, 15, 16, 17, 18, 31, and 32. Patient has an impacted #11. Patient  recently lost his primary canine letter H 2-3 weeks ago. 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 Multiple malpositioned teeth are noted. PERIODONTAL:  Patient has chronic periodontitis with plaque accumulations, selective areas gingival recession and no significant tooth mobility. DENTAL CARIES/SUBOPTIMAL RESTORATIONS:  No dental caries are noted. Patient has fractured porcelain on the crown on tooth #19. ENDODONTIC:  Patient had recent root canal therapy on tooth #29. There is still a persistent periapical radiolucency. Patient denies having any symptoms associated with tooth #29 at this time. CROWN AND BRIDGE:  Patient has a crown on tooth #19. There is fractured porcelain on the distal lingual cusp area PROSTHODONTIC: The patient denies having partial dentures.  OCCLUSION:  The patient has a poor occlusal scheme secondary to multiple missing teeth, deep overbite, right posterior crossbite, multiple malpositioned teeth,and lack of replacement of all missing teeth with dental prostheses.  RADIOGRAPHIC INTERPRETATION: An orthopantogram was taken and supplemented with a full series of  dental radiographs. There are multiple missing teeth. There is incipient bone loss. 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 There is a root canal therapy associated with tooth #29 with persistent periapical radiolucency. There is radiographic evidence of incisal attrition.  ASSESSMENTS: 1. Left parotid cancer status post surgical resection 2. Preradiation therapy dental protocol 3. Chronic periodontitis with bone loss 4. Gingival recession 5. Accretions 6. Multiple missing teeth 7. Impacted tooth #11 8. Multiple diastemas 9. Deep overbite 10. Right posterior crossbite 11. Multiple malpositioned teeth 12. Poor occlusal scheme and malocclusion 13. Recent root canal therapy of tooth #29 with persistent therapy or radiolucency. 14. Suboptimal crown restoration on tooth #18 secondary to fractured  porcelain  PLAN/RECOMMENDATIONS: 1. I discussed the risks, benefits, and complications of various treatment options with the patient in relationship to his medical and dental conditions, anticipated radiation therapy, and radiation therapy side effects to include xerostomia, radiation caries, trismus, mucositis, taste changes, gum and jawbone changes, and risk for infection and osteoradionecrosis.. We discussed various treatment options to include no treatment, extraction of teeth in the primary field radiation therapy, alveoloplasty, pre-prosthetic surgery as indicated, periodontal therapy, dental restorations, root canal therapy, crown and bridge therapy, implant therapy, and replacement of missing teeth as indicated. According to review of the anticipated ports and doses, no teeth are in the primary field radiation therapy and NO extractions were recommended. The patient did agree to proceed with impressions today for the fabrication of fluoride trays and scatter protection devices. A prescription for PreviDent 5000 toothpaste was sent to his [facility] with refills for one year. The patient is currently cleared for radiation therapy.  The patient may proceed with simulation once the scatter protection devices are inserted.  Patient was instructed to follow-up with his primary dentist for evaluation of healing of bone around tooth #29 but had a recent root canal therapy. Patient also will follow-up with his primary dentist for a new crown on #19 and evaluation for replacement of missing tooth in the area of the left primary canine if the patient so desires.   2. Discussion of findings with medical team and coordination of future medical and dental care as needed.  I spent in excess of  120 minutes during the conduct of this consultation and >50% of this time involved direct face-to-face encounter for counseling and/or coordination of the patient's care.    Lenn Cal, DDS

## 2019-01-09 NOTE — Progress Notes (Signed)
Radiation Oncology         [phone] ________________________________  Initial outpatient Consultation  Name: Christopher Pacheco MRN: [id number]  Date: 01/09/2019  DOB: 24-Jul-1961  WC:HENI, Christopher Luo, MD  Christopher Pacheco, *   REFERRING PHYSICIAN: Rozetta Pacheco, *  DIAGNOSIS:    ICD-10-CM   1. 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 Cancer of parotid gland [facility]) C07   Cancer Staging Cancer of parotid gland [facility]) Staging form: Major Salivary Glands, AJCC 8th Edition - Pathologic stage from 01/09/2019: Stage II (pT2, pN0, cM0) - Signed by Christopher Gibson, MD on 01/09/2019   CHIEF COMPLAINT: Here to discuss management of parotid cancer  HISTORY OF PRESENT ILLNESS::[name] is a 58 y.o. male who presented with a left parotid mass for over a year. He states his dentist recommended he see his PCP for the mass. He did so after being prompted more than once and he is now glad he did.  Subsequently, the patient saw Dr. Lucia Pacheco who performed biopsy with negative results. He proceeded to left parotidectomy with facial nerve dissection with removal of a deep lobe left parotid tumor on 12/01/2018. Biopsy from the procedure revealed: polymorphous adenocarcinoma, 3.2 cm; margins of resection not involved; one lymph node, negative for carcinoma. No pre-op imaging.  Pertinent imaging thus far includes CT scans of the head, chest, and neck performed on 12/21/2018 revealing small nonspecific pulmonary nodules bilaterally measuring 3 mm or smaller and left level 2 lymph nodes with uncertain significance.   Swallowing issues, if any: none  Weight Changes: none  Pain status: He denies any pain.  Other symptoms: left-sided facial weakness, warmth and numbness to the preauricular area/left lower ear, increased fatigue, and increased thirst. He denies any  auditory changes.  Tobacco history, if any: none  ETOH abuse, if any: none, drinks alcohol on the weekends.  Prior cancers, if any: none  He is scheduled to meet with Dr. Enrique Pacheco later today, 01/09/2019.   PREVIOUS RADIATION THERAPY: No  PAST MEDICAL HISTORY:  has a past medical history of Allergy, Diffuse infection of pancreas, Diverticulitis, Fatty liver, and Medical history non-contributory.    PAST SURGICAL HISTORY: Past Surgical History:  Procedure Laterality Date  . COLONOSCOPY    . PAROTIDECTOMY Left 12/01/2018   Procedure: PAROTIDECTOMY;  Surgeon: Christopher Nunnery, MD;  Location: [facility];  Service: ENT;  Laterality: Left;    FAMILY HISTORY: family history is not on file.  SOCIAL HISTORY:  reports that he has never smoked. He has never used smokeless tobacco. He reports current alcohol use of about 2.0 standard drinks of alcohol per week. He reports that he does not use drugs.  ALLERGIES: Patient has no known allergies.  MEDICATIONS:  Current Outpatient Medications  Medication Sig Dispense Refill  . HYDROcodone-acetaminophen (NORCO/VICODIN) 5-325 MG tablet Take 1-2 tablets by mouth every 6 (six) hours as needed for moderate pain. (Patient not taking: Reported on 01/09/2019) 12 tablet 0  . ibuprofen (ADVIL,MOTRIN) 200 MG tablet Take 200 mg by mouth every 6 (six) hours as needed.    . sodium fluoride (PREVIDENT 5000 PLUS) 1.1 % CREA dental cream Instill cream into fluoride tray. Place over teeth for 5 minutes. Remove. Spit out excess. Do not rinse. Repeat nightly. 1 Tube prn   No current facility-administered medications for this encounter.  REVIEW OF SYSTEMS:  A 10+ POINT REVIEW OF SYSTEMS WAS OBTAINED including neurology, dermatology, psychiatry, cardiac, respiratory, lymph, extremities, GI, GU, Musculoskeletal, constitutional, breasts, reproductive, HEENT.  All pertinent positives are noted in the HPI.  All others are negative.    PHYSICAL  EXAM:  height is 5\' 10"  (1.778 m) and weight is 163 lb 8 oz (74.2 kg). His oral temperature is 98.3 F (36.8 C). His blood pressure is 118/76 and his pulse is 64. His respiration is 18 and oxygen saturation is 100%.   General: Alert and oriented, in no acute distress HEENT: Head is normocephalic. Extraocular movements are intact. Oropharynx and oral cavity are clear. Mucosa is very moist. Neck: Neck is notable for no palpable masses. Scar at the level 2 region extending to the preauricular region of the left neck has healed well. He has very mild swelling in the left jaw. He has wax impacting the left and right ear canals. Heart: Regular in rate and rhythm with no murmurs, rubs, or gallops. Chest: Clear to auscultation bilaterally, with no rhonchi, wheezes, or rales. Abdomen: Soft, nontender, nondistended, with no rigidity or guarding. Extremities: No cyanosis or edema. Lymphatics: see Neck Exam Skin: No concerning lesions. Musculoskeletal: symmetric strength and muscle tone throughout. Neurologic:  Speech is fluent. Coordination is intact. He does not completely blink on the left side. And his eyebrow does not raise on the left side. When he smiles, the left mouth does not turn up as much. Psychiatric: Judgment and insight are intact. Affect is appropriate.   ECOG = 0  0 - Asymptomatic (Fully active, able to carry on all predisease activities without restriction)  1 - Symptomatic but completely ambulatory (Restricted in physically strenuous activity but ambulatory and able to carry out work of a light or sedentary nature. For example, light housework, office work)  2 - Symptomatic, <50% in bed during the day (Ambulatory and capable of all self care but unable to carry out any work activities. Up and about more than 50% of waking hours)  3 - Symptomatic, >50% in bed, but not bedbound (Capable of only limited self-care, confined to bed or chair 50% or more of waking hours)  4 - Bedbound  (Completely disabled. Cannot carry on any self-care. Totally confined to bed or chair)  5 - Death   Eustace Pen MM, Creech RH, Tormey DC, et al. [phone]). "Toxicity and response criteria of the [facility]". [place] Oncol. 5 (6): 649-55   LABORATORY DATA:  No results found for: WBC, HGB, HCT, MCV, PLT CMP  No results found for: NA, K, CL, CO2, GLUCOSE, BUN, CREATININE, CALCIUM, PROT, ALBUMIN, AST, ALT, ALKPHOS, BILITOT, GFRNONAA, GFRAA    No results found for: TSH   RADIOGRAPHY: Ct Head W & Wo Contrast  Result Date: 12/21/2018 CLINICAL DATA:  LEFT parotid gland cancer. Staging. EXAM: CT HEAD WITHOUT AND WITH CONTRAST TECHNIQUE: Contiguous axial images were obtained from the base of the skull through the vertex without and with intravenous contrast CONTRAST:  14mL OMNIPAQUE IOHEXOL 300 MG/ML  SOLN COMPARISON:  None. FINDINGS: Brain: No evidence of acute infarction, hemorrhage, hydrocephalus, extra-axial collection or mass lesion/mass effect. Normal cerebral volume. No white matter disease. Post infusion, no abnormal enhancement of the brain or meninges. Incidental note of developmental venous anomaly, midline superior vermis. Vascular: No hyperdense vessel or unexpected calcification. Visible vessels are patent. Skull: Normal. Negative for fracture or focal lesion. Sinuses/Orbits: No acute finding. Other: Postsurgical change LEFT parotid gland described  separately. IMPRESSION: Negative exam. Electronically Signed   By: Staci Righter M.D.   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 Continued surveillance. EXAM: CT NECK WITH CONTRAST TECHNIQUE: Multidetector CT imaging of the neck was performed using the standard protocol following the bolus administration of intravenous contrast.  CONTRAST:  163mL OMNIPAQUE IOHEXOL 300 MG/ML  SOLN COMPARISON:  None. FINDINGS: No preoperative imaging is available for correlation. Pharynx and larynx: Normal. No mass or swelling. Salivary glands: Status post LEFT parotid gland dissection for tumor removal. There are heterogeneous areas of enhancement throughout the posterior superficial and residual deep lobe, which could represent postoperative change versus infiltrative tumor. RIGHT parotid gland normal. Submandibular glands normal. Thyroid: Normal. Lymph nodes: LEFT level 2 nodes of uncertain significance not pathologically enlarged. No nodes greater than 10 mm short axis or hyperenhancing. Vascular: Negative. Limited intracranial: Negative. Visualized orbits: Negative. Mastoids and visualized paranasal sinuses: Clear. Skeleton: No acute or aggressive process. Upper chest: Negative. Other: None. IMPRESSION: 1. Status post LEFT parotid gland dissection for tumor removal. 2. There are heterogeneous areas of enhancement throughout the posterior superficial and residual deep lobe, which could represent postoperative change versus infiltrative tumor. 3. LEFT level 2 lymph nodes of uncertain significance. 4. MRI of face and neck without and with contrast is recommended for further evaluation, as well as correlation with preoperative imaging if available. Electronically Signed   By: Staci Righter M.D.   On: 12/21/2018 13:24   Ct Chest W Contrast  Result Date: 12/21/2018 CLINICAL DATA:  Parotid gland malignancy EXAM: CT CHEST WITH CONTRAST TECHNIQUE: Multidetector CT imaging of the chest was performed during intravenous contrast administration. CONTRAST:  137mL OMNIPAQUE IOHEXOL 300 MG/ML  SOLN COMPARISON:  None. FINDINGS: Cardiovascular: No significant vascular findings. Normal heart size. No pericardial effusion. Mediastinum/Nodes: No enlarged mediastinal, hilar, or axillary lymph nodes. Thyroid gland, trachea, and esophagus demonstrate no significant findings.  Lungs/Pleura: There are small nonspecific pulmonary nodules present bilaterally, measuring 3 mm or smaller, including of the left lower lobe (series 9, image 113, 103) and right lower lobe (series 9, image 93). No pleural effusion or pneumothorax. Upper Abdomen: No acute abnormality. Musculoskeletal: No chest wall mass or suspicious bone lesions identified. IMPRESSION: Small nonspecific pulmonary nodules present bilaterally measuring 3 mm or smaller as detailed above. Attention on follow-up, schedule as indicated by clinical oncology protocol for known parotid malignancy. Electronically Signed   By: Eddie Candle M.D.   On: 12/21/2018 13:32      IMPRESSION/PLAN: Parotid Cancer  This is a delightful patient with head and neck cancer. I recommend adjuvant radiotherapy for this patient.  We discussed the rationale for adjuvant therapy.  While his tumor was low-grade, with negative lymphovascular invasion and no sign of perineural invasion, there are some reasons that warrant adjuvant radiotherapy.  First of all he had very close margins of less than 1 mm.  Secondly he had a sizable tumor, and his otolaryngologist, Dr. Lucia Pacheco, told me that it was really difficult to ascertain the borders of the tumor.  It was not well-defined during the operation.  Also, the patient only had 1 lymph node removed and the remainder of his neck has not been treated.  I would treat not only the parotid gland but also the ipsilateral neck empirically.  We discussed the potential risks, benefits,  and side effects of radiotherapy. We talked in detail about acute and late effects. We discussed that some of the most bothersome acute effects may be mucositis, dysgeusia, salivary changes, skin irritation, hair loss, dehydration, weight loss and fatigue. We talked about late effects which include but are not necessarily limited to scar tissue, increased risk of dental issues such as cavities, dysphagia, xerostomia, trismus, and neck edema.  No guarantees of treatment were given. A consent form was signed and placed in the patient's medical record. The patient is enthusiastic about proceeding with treatment. I look forward to participating in the patient's care.    Simulation (treatment planning) will take place in the near future after scatter guards have been made by dentistry.  Anticipate 6 weeks, 30 treatments to the left parotid bed and neck  We also discussed that the treatment of head and neck cancer is a multidisciplinary process to maximize treatment outcomes and quality of life. For this reasons the following referrals have been or will be made:  1. Dentistry for dental evaluation, possible extractions in the radiation fields, and /or advice on reducing risk of cavities, osteoradionecrosis, or other oral issues.  2. Nutritionist for nutrition support during and after treatment.  3. Social work for social support.   4. Physical therapy as needed due to risk of lymphedema in neck and deconditioning.  In a visit lasting 75 minutes, greater than 50% of the time was spent face to face discussing logistics of treatment, and coordinating the patient's care.    __________________________________________   Christopher Gibson, MD   This document serves as a record of services personally performed by Christopher Gibson, MD. It was created on her behalf by Wilburn Mylar, a trained medical scribe. The creation of this record is based on the scribe's personal observations and the provider's statements to them. This document has been checked and approved by the attending provider.

## 2019-01-11 ENCOUNTER — Encounter (HOSPITAL_COMMUNITY): Payer: Self-pay | Admitting: Dentistry

## 2019-01-11 ENCOUNTER — Ambulatory Visit (HOSPITAL_COMMUNITY): Payer: PRIVATE HEALTH INSURANCE | Admitting: Dentistry

## 2019-01-11 VITALS — BP 130/78 | HR 62 | Temp 97.8°F

## 2019-01-11 DIAGNOSIS — Z463 Encounter for fitting and adjustment of dental prosthetic device: Secondary | ICD-10-CM

## 2019-01-11 DIAGNOSIS — Z01818 Encounter for other preprocedural examination: Secondary | ICD-10-CM

## 2019-01-11 DIAGNOSIS — C07 Malignant neoplasm of parotid gland: Secondary | ICD-10-CM

## 2019-01-11 NOTE — Progress Notes (Signed)
01/11/2019  Patient Name:   Christopher Pacheco Date of Birth:   06-25-1961 Medical Record Number: [id number]  BP 130/78 (BP Location: Right Arm)   Pulse 62   Temp 97.8 F (36.6 C)   Ulysee E Whisner now presents for insertion of upper and lower fluoride trays and scatter protection devices.  PROCEDURE: Appliances were tried in and adjusted as needed. [place] Trismus device was previously fabricated at 35 mm using 18 sticks. Postop instructions were provided and a written and verbal format concerning the use and care of appliances. All questions were answered. Patient to return to clinic for periodic oral examination in approximately 2-3 weeks during radiation therapy. Patient is to call dental medicine once his radiation therapy schedule is known to allow for scheduling of the follow-up examination. Patient to call if questions or problems arise before then.   Lenn Cal, DDS

## 2019-01-11 NOTE — Patient Instructions (Signed)
FLUORIDE TRAYS PATIENT INSTRUCTIONS    Obtain Prevident 5000 prescription from the pharmacy.  Don't be surprised if it needs to be ordered.  Be sure to let the pharmacy know when you are close to needing a new refill for them to have it ready for you without interruption of Fluoride use.  The best time to use your Fluoride is before bedtime.  You must brush your teeth very well and floss before using the Fluoride in order to get the best use out of the Fluoride treatments.  Place Fluoride gel in the tray and spread gel around in the tray with your finger or cotton tip applicator.  Place the tray on your lower teeth and your upper teeth.  Make sure the trays are seated all the way.  Remember, they only fit one way on your teeth.  Insert for 5 full minutes.  At the end of the 5 minutes, take the trays out.  SPIT OUT excess.   Do NOT rinse your mouth!  Do NOT eat or drink after treatments for at least 30 minutes.  This is why the best time for your treatments is before bedtime.  Clean the inside of your Fluoride trays using COLD WATER and a toothbrush.  In order to keep your Trays from discoloring and free from odors, soak them overnight in denture cleaners such as Efferdent.  Do not use bleach or non denture products.  Store the trays in a safe dry place AWAY from any heat until your next treatment.  If anything happens to your Fluoride trays, or they don't fit as well after any dental work, please let us know as soon as possible.  

## 2019-01-12 ENCOUNTER — Encounter: Payer: Self-pay | Admitting: *Deleted

## 2019-01-12 ENCOUNTER — Ambulatory Visit
Admission: RE | Admit: 2019-01-12 | Discharge: 2019-01-12 | Disposition: A | Discharge status: Home / Self Care | Payer: BC Managed Care – PPO | Source: Ambulatory Visit | Attending: Radiation Oncology | Admitting: Radiation Oncology

## 2019-01-12 DIAGNOSIS — C07 Malignant neoplasm of parotid gland: Secondary | ICD-10-CM | POA: Diagnosis not present

## 2019-01-12 DIAGNOSIS — Z510 Encounter for antineoplastic radiation therapy: Secondary | ICD-10-CM | POA: Insufficient documentation

## 2019-01-12 NOTE — Progress Notes (Signed)
Head and Neck Cancer Simulation, IMRT treatment planning note   Outpatient  Diagnosis:    ICD-10-CM   1. Cancer of parotid gland [facility]) C07     The patient was taken to the CT simulator and laid in the supine position on the table. An Aquaplast head and shoulder mask was custom fitted to the patient's anatomy. High-resolution CT axial imaging was obtained of the head and neck with contrast. I verified that the quality of the imaging is good for treatment planning. 1 Medically Necessary Treatment Device was fabricated and supervised by me: Aquaplast mask.  Treatment planning note I plan to treat the patient with IMRT. I plan to treat the patient's parotid bed and left neck nodes. I plan to treat to a total dose of 60 Gray in 30  fractions. Dose calculation was ordered from dosimetry.  IMRT planning Note  IMRT is medically necessary and an important modality to deliver adequate dose to the patient's at risk tissues while sparing the patient's normal structures, including the: esophagus, parotid tissue, mandible, brain stem, spinal cord, oral cavity, brachial plexus.  This justifies the use of IMRT in the patient's treatment.    [name], MD

## 2019-01-14 NOTE — Progress Notes (Signed)
Oncology Nurse Navigator Documentation  Met with patient during initial consult with Dr. Isidore Moos.  He was accompanied by his wife.   . Further introduced myself as his Navigator, explained my role as a member of the Care Team.   . Provided New Patient Information packet, discussed contents: o Contact information for physician(s), myself, other members of the Care Team. o Advance Directive information ([place] blue pamphlet with LCSW contact info).  He stated he has ADs, will bring copy. o Fall Prevention Patient Safety Plan o Appointment Guideline o [facility] [place] campus map with highlight of [place] o SLP information sheet o Symptom Management Clinic information . Provided introductory explanation of radiation treatment including SIM planning and purpose of Aquaplast head and shoulder mask, showed them example.   Marland Kitchen He stated will need letter to support cancellation of/refund for trip to [place], voiced understanding will be provided in next week.  . I encouraged them to contact me with questions/concerns as treatments/procedures begin.  . Escorted them to [place] to familiarize for 11:00 appt. They verbalized understanding of information provided.    Gayleen Orem, RN, BSN Head & Neck Oncology Nurse [place] at [place] [phone]

## 2019-01-14 NOTE — Progress Notes (Signed)
Oncology Nurse Navigator Documentation  To provide support, encouragement and care continuity, met with Christopher Pacheco at beginning and after his CT SIM. He was accompanied by his wife.   He reported toleration of procedure without difficulty, denied questions/concerns.  I showed him registration station in lobby, explained registration procedure, arrival to Radiation Waiting, arrival to tmt area and preparation for tmt.  They voiced understanding.   We discussed his attendance at 3/10 H&N [place] with tentative arrival scheduled for 0800. I encouraged him to call me prior to 3/10 [place].  Gayleen Orem, RN, BSN Head & Neck Oncology Nurse [place] at [place] [phone]

## 2019-01-19 ENCOUNTER — Telehealth: Payer: Self-pay | Admitting: *Deleted

## 2019-01-19 DIAGNOSIS — C07 Malignant neoplasm of parotid gland: Secondary | ICD-10-CM | POA: Diagnosis not present

## 2019-01-19 DIAGNOSIS — Z510 Encounter for antineoplastic radiation therapy: Secondary | ICD-10-CM | POA: Diagnosis not present

## 2019-01-19 NOTE — Telephone Encounter (Signed)
Oncology Nurse Navigator Documentation  Called Mr. Dripps, confirmed his understanding of 0800 arrival Tuesday morning for H&N [facility], reviewed registration/ arrival procedures.  He voiced understanding.  Gayleen Orem, RN, BSN Head & Neck Oncology Nurse [place] at [place] [phone]

## 2019-01-23 ENCOUNTER — Ambulatory Visit
Admission: RE | Admit: 2019-01-23 | Discharge: 2019-01-23 | Disposition: A | Discharge status: Home / Self Care | Payer: BC Managed Care – PPO | Source: Ambulatory Visit | Attending: Radiation Oncology | Admitting: Radiation Oncology

## 2019-01-23 ENCOUNTER — Encounter: Payer: Self-pay | Admitting: Physical Therapy

## 2019-01-23 ENCOUNTER — Ambulatory Visit: Payer: BC Managed Care – PPO | Admitting: Physical Therapy

## 2019-01-23 ENCOUNTER — Encounter: Payer: Self-pay | Admitting: *Deleted

## 2019-01-23 ENCOUNTER — Ambulatory Visit: Payer: BC Managed Care – PPO

## 2019-01-23 ENCOUNTER — Inpatient Hospital Stay: Payer: BC Managed Care – PPO | Attending: Family Medicine | Admitting: Nutrition

## 2019-01-23 ENCOUNTER — Other Ambulatory Visit: Payer: Self-pay

## 2019-01-23 DIAGNOSIS — R293 Abnormal posture: Secondary | ICD-10-CM

## 2019-01-23 DIAGNOSIS — Z510 Encounter for antineoplastic radiation therapy: Secondary | ICD-10-CM | POA: Diagnosis not present

## 2019-01-23 NOTE — Progress Notes (Signed)
Patient was seen during head and neck clinic.  Patient is a 58 year old male with left parotid, P16 not determined, stage II cancer. He is status post a left parotidectomy on January 17. He is scheduled for 60 Gy adjuvant radiation therapy in 30 fractions to the left parotid bed and left neck nodes starting March 10.  Past medical history includes diverticulitis and fatty liver.  Medications were reviewed.  Labs were reviewed.  Height: 5 feet 10 inches. Weight: 164.8 pounds. BMI: 23.6.  Patient reports he generally eats a plant-based diet and enjoys most protein foods.   He is currently denying nutrition impact symptoms.  Nutrition diagnosis:  Predicted suboptimal energy intake related to parotid gland cancer and associated treatments as evidenced by a history of a condition for which research shows suboptimal energy intake.  Intervention: Patient was educated to consume smaller more frequent meals and snacks with adequate calories to promote weight maintenance. Educated patient on the importance of increased protein. Brief strategies reviewed to improve dry mouth/thick saliva. Fact sheets were provided.  Questions were answered.  Teach back method used.  Contact information was given.  Monitoring, evaluation, goals: Patient will tolerate adequate calories and protein for weight maintenance.  Next visit: To be scheduled weekly as needed.

## 2019-01-23 NOTE — Therapy (Signed)
[place], [place], [address] Phone: [phone]   Fax:  [phone]  Physical Therapy Evaluation  Patient Details  Name: Christopher Pacheco MRN: [id number] Date of Birth: 1961-06-18 Referring Provider (PT): Reita May Date: 01/23/2019  PT End of Session - 01/23/19 0857    Visit Number  1    Number of Visits  1    PT Start Time  0819    PT Stop Time  0845    PT Time Calculation (min)  26 min    Activity Tolerance  Patient tolerated treatment well    Behavior During Therapy  [facility] for tasks assessed/performed       Past Medical History:  Diagnosis Date  . Allergy   . Diffuse infection of pancreas    30 years ago  . Diverticulitis   . Fatty liver    Dr. Watt Climes  . Medical history non-contributory     Past Surgical History:  Procedure Laterality Date  . COLONOSCOPY    . PAROTIDECTOMY Left 12/01/2018   Procedure: PAROTIDECTOMY;  Surgeon: [name], MD;  Location: [place];  Service: ENT;  Laterality: Left;    There were no vitals filed for this visit.   Subjective Assessment - 01/23/19 0852    Subjective  I have this facial palsy and it is hard to work at times because it gets somewhat blurry and dry.    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  No/denies    Pain Score  0-No pain         [facility] PT Assessment - 01/23/19 0001      Assessment   Medical Diagnosis  L parotid cancer    Referring Provider (PT)  Isidore Moos    Onset Date/Surgical Date  12/01/18    Hand Dominance  Left    Prior Therapy  none      Precautions   Precautions  Other (comment)    Precaution Comments   active cancer      Restrictions   Weight Bearing Restrictions  No      Balance Screen   Has the patient fallen in the past 6 months  No    Has the patient had a decrease in activity level because of a fear of falling?   No    Is the patient reluctant to leave their home because of a fear of falling?   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 to Stand   Comments  18 reps which is average for his  age      Posture/Postural Control   Posture/Postural Control  Postural limitations    Postural Limitations  Rounded Shoulders      ROM / Strength   AROM / PROM / Strength  AROM      AROM   Overall AROM   Within functional limits for tasks performed    Overall AROM Comments  both cervical and shoulder ROM are [facility]      Ambulation/Gait   Ambulation/Gait  Yes    Ambulation/Gait Assistance  7: Independent    Ambulation Distance (Feet)  10 Feet    Gait Pattern  Within Functional Limits        LYMPHEDEMA/ONCOLOGY QUESTIONNAIRE - 01/23/19 1020      Lymphedema Assessments   Lymphedema Assessments  Head and Neck      Head and Neck   4 cm superior to sternal notch around neck  39.5 cm    6 cm superior to sternal notch around neck  38.5 cm    8 cm superior to sternal notch around neck  39 cm             Objective measurements completed on examination: See above findings.              PT Education - 01/23/19 0856    Education Details  Neck ROM, posture, breathing, walking, CURE article on staying active, "Why  exercise?" flyer, lymphedema and PT info    Person(s) Educated  Patient;Spouse    Methods  Explanation;Handout    Comprehension  Verbalized understanding;Returned demonstration              Head and Neck Clinic Goals - 01/23/19 [phone]      Patient will be able to verbalize understanding of a home exercise program for cervical range of motion, posture, and walking.    Status  Achieved      Patient will be able to verbalize understanding of proper sitting and standing posture.    Status  Achieved      Patient will be able to verbalize understanding of lymphedema risk and availability of treatment for this condition.    Status  Achieved         Plan - 01/23/19 0857    Clinical Impression Statement  Pt presents to head and neck clinic after recently undergoing surgery for L parotid cancer. He is starting radiation today. His cervical and shoulder ROM are WFL. He is very active and exercises daily either swimmng or walking. Educated pt about signs and symptoms of lymphedema and issued head and neck ROM exercises to avoid increased tightness throughout radiation. Pt is haivng some facial palsy since surgery and wants to begin exercises for this. Gave pt contact info for neuro rehab if he would like to persue an evaluation for this. Pt does not require additional skilled PT services at this time.     Stability/Clinical Decision Making  Stable/Uncomplicated    Clinical Decision Making  Low    Rehab Potential  Excellent    PT Frequency  One time visit    PT Treatment/Interventions  ADLs/Self Care Home Management;Patient/family education;Therapeutic exercise    PT Next Visit Plan  one time visit    PT Home Exercise Plan  head and neck ROM exercises    Consulted and Agree with Plan of Care  Patient       Patient will benefit from skilled therapeutic intervention in order to improve the following deficits and impairments:  Postural dysfunction, Decreased  knowledge of  precautions  Visit Diagnosis: Abnormal posture     Problem List Patient Active Problem List   Diagnosis Date Noted  . Cancer of parotid gland ([place]) 01/09/2019  . Parotid mass 12/01/2018    Allyson Sabal [facility] [place] 01/23/2019, 10:21 AM  [place], [place], [address] Phone: [phone]   Fax:  [phone]  Name: Christopher Pacheco MRN: [id number] Date of Birth: 1961-06-20  Manus Gunning, PT 01/23/19 10:21 AM

## 2019-01-24 ENCOUNTER — Ambulatory Visit
Admission: RE | Admit: 2019-01-24 | Discharge: 2019-01-24 | Disposition: A | Discharge status: Home / Self Care | Payer: BC Managed Care – PPO | Source: Ambulatory Visit | Attending: Radiation Oncology | Admitting: Radiation Oncology

## 2019-01-24 DIAGNOSIS — Z510 Encounter for antineoplastic radiation therapy: Secondary | ICD-10-CM | POA: Diagnosis not present

## 2019-01-25 ENCOUNTER — Other Ambulatory Visit: Payer: Self-pay

## 2019-01-25 ENCOUNTER — Ambulatory Visit
Admission: RE | Admit: 2019-01-25 | Discharge: 2019-01-25 | Disposition: A | Discharge status: Home / Self Care | Payer: BC Managed Care – PPO | Source: Ambulatory Visit | Attending: Radiation Oncology | Admitting: Radiation Oncology

## 2019-01-25 DIAGNOSIS — Z510 Encounter for antineoplastic radiation therapy: Secondary | ICD-10-CM | POA: Diagnosis not present

## 2019-01-26 ENCOUNTER — Other Ambulatory Visit: Payer: Self-pay

## 2019-01-26 ENCOUNTER — Ambulatory Visit
Admission: RE | Admit: 2019-01-26 | Discharge: 2019-01-26 | Disposition: A | Discharge status: Home / Self Care | Payer: BC Managed Care – PPO | Source: Ambulatory Visit | Attending: Radiation Oncology | Admitting: Radiation Oncology

## 2019-01-26 DIAGNOSIS — Z510 Encounter for antineoplastic radiation therapy: Secondary | ICD-10-CM | POA: Diagnosis not present

## 2019-01-27 NOTE — Progress Notes (Signed)
Oncology Nurse Navigator Documentation  Christopher Pacheco was met upon his arrival for H&N [place].    Provided verbal and written overview of [place], the clinicians who will be seeing him, encouraged him to ask questions during his time with them.  He was seen by Nutrition, PT, SW and RadOnc Investment banker, operational.  Gayleen Orem, RN, [place] at [place] [phone]

## 2019-01-29 ENCOUNTER — Ambulatory Visit
Admission: RE | Admit: 2019-01-29 | Discharge: 2019-01-29 | Disposition: A | Discharge status: Home / Self Care | Payer: BC Managed Care – PPO | Source: Ambulatory Visit | Attending: Radiation Oncology | Admitting: Radiation Oncology

## 2019-01-29 DIAGNOSIS — C07 Malignant neoplasm of parotid gland: Secondary | ICD-10-CM

## 2019-01-29 DIAGNOSIS — Z510 Encounter for antineoplastic radiation therapy: Secondary | ICD-10-CM | POA: Diagnosis not present

## 2019-01-29 MED ORDER — SONAFINE EX EMUL
1.0000 "application " | Freq: Once | CUTANEOUS | Status: AC
  Administered 2019-01-29: 1 via TOPICAL

## 2019-01-29 NOTE — Progress Notes (Signed)

## 2019-01-30 ENCOUNTER — Ambulatory Visit
Admission: RE | Admit: 2019-01-30 | Discharge: 2019-01-30 | Disposition: A | Discharge status: Home / Self Care | Payer: BC Managed Care – PPO | Source: Ambulatory Visit | Attending: Radiation Oncology | Admitting: Radiation Oncology

## 2019-01-30 ENCOUNTER — Inpatient Hospital Stay: Payer: BC Managed Care – PPO | Admitting: Nutrition

## 2019-01-30 ENCOUNTER — Other Ambulatory Visit: Payer: Self-pay

## 2019-01-30 DIAGNOSIS — Z510 Encounter for antineoplastic radiation therapy: Secondary | ICD-10-CM | POA: Diagnosis not present

## 2019-01-30 NOTE — Progress Notes (Signed)
Completed nutrition follow-up via telephone. 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 Patient reports his weight is stable.  He weighs 155 pounds on his home scale and monitors this daily. He has been eating a plant-based diet and has increased plant-based foods and feels he is getting adequate protein. He is currently denying nutrition impact symptoms.  Nutrition diagnosis: Predicted suboptimal energy intake continues.  Intervention: Educated patient to continue strategies for adequate calorie and protein intake for weight maintenance.  Monitoring, evaluation, goals: Patient will work to tolerate adequate calories and protein for weight maintenance. Agrees to contact me if he has further questions or concerns.  Next visit is scheduled on Tuesday, March 31 after radiation therapy.  Patient is aware of appointment.  **Disclaimer: This note was dictated with voice recognition software. Similar sounding words can inadvertently be transcribed and this note may contain transcription errors which may not have been corrected upon publication of note.**

## 2019-01-31 ENCOUNTER — Other Ambulatory Visit: Payer: Self-pay

## 2019-01-31 ENCOUNTER — Ambulatory Visit
Admission: RE | Admit: 2019-01-31 | Discharge: 2019-01-31 | Disposition: A | Discharge status: Home / Self Care | Payer: BC Managed Care – PPO | Source: Ambulatory Visit | Attending: Radiation Oncology | Admitting: Radiation Oncology

## 2019-01-31 DIAGNOSIS — Z510 Encounter for antineoplastic radiation therapy: Secondary | ICD-10-CM | POA: Diagnosis not present

## 2019-02-01 ENCOUNTER — Ambulatory Visit
Admission: RE | Admit: 2019-02-01 | Discharge: 2019-02-01 | Disposition: A | Discharge status: Home / Self Care | Payer: BC Managed Care – PPO | Source: Ambulatory Visit | Attending: Radiation Oncology | Admitting: Radiation Oncology

## 2019-02-01 ENCOUNTER — Other Ambulatory Visit: Payer: Self-pay

## 2019-02-01 DIAGNOSIS — Z510 Encounter for antineoplastic radiation therapy: Secondary | ICD-10-CM | POA: Diagnosis not present

## 2019-02-02 ENCOUNTER — Ambulatory Visit
Admission: RE | Admit: 2019-02-02 | Discharge: 2019-02-02 | Disposition: A | Discharge status: Home / Self Care | Payer: BC Managed Care – PPO | Source: Ambulatory Visit | Attending: Radiation Oncology | Admitting: Radiation Oncology

## 2019-02-02 ENCOUNTER — Other Ambulatory Visit: Payer: Self-pay

## 2019-02-02 DIAGNOSIS — Z510 Encounter for antineoplastic radiation therapy: Secondary | ICD-10-CM | POA: Diagnosis not present

## 2019-02-05 ENCOUNTER — Ambulatory Visit
Admission: RE | Admit: 2019-02-05 | Discharge: 2019-02-05 | Disposition: A | Discharge status: Home / Self Care | Payer: BC Managed Care – PPO | Source: Ambulatory Visit | Attending: Radiation Oncology | Admitting: Radiation Oncology

## 2019-02-05 DIAGNOSIS — Z510 Encounter for antineoplastic radiation therapy: Secondary | ICD-10-CM | POA: Diagnosis not present

## 2019-02-06 ENCOUNTER — Ambulatory Visit
Admission: RE | Admit: 2019-02-06 | Discharge: 2019-02-06 | Disposition: A | Discharge status: Home / Self Care | Payer: BC Managed Care – PPO | Source: Ambulatory Visit | Attending: Radiation Oncology | Admitting: Radiation Oncology

## 2019-02-06 ENCOUNTER — Other Ambulatory Visit: Payer: Self-pay

## 2019-02-06 DIAGNOSIS — Z510 Encounter for antineoplastic radiation therapy: Secondary | ICD-10-CM | POA: Diagnosis not present

## 2019-02-07 ENCOUNTER — Ambulatory Visit
Admission: RE | Admit: 2019-02-07 | Discharge: 2019-02-07 | Disposition: A | Discharge status: Home / Self Care | Payer: BC Managed Care – PPO | Source: Ambulatory Visit | Attending: Radiation Oncology | Admitting: Radiation Oncology

## 2019-02-07 ENCOUNTER — Other Ambulatory Visit: Payer: Self-pay

## 2019-02-07 DIAGNOSIS — Z510 Encounter for antineoplastic radiation therapy: Secondary | ICD-10-CM | POA: Diagnosis not present

## 2019-02-08 ENCOUNTER — Other Ambulatory Visit: Payer: Self-pay

## 2019-02-08 ENCOUNTER — Ambulatory Visit
Admission: RE | Admit: 2019-02-08 | Discharge: 2019-02-08 | Disposition: A | Discharge status: Home / Self Care | Payer: BC Managed Care – PPO | Source: Ambulatory Visit | Attending: Radiation Oncology | Admitting: Radiation Oncology

## 2019-02-08 DIAGNOSIS — Z510 Encounter for antineoplastic radiation therapy: Secondary | ICD-10-CM | POA: Diagnosis not present

## 2019-02-09 ENCOUNTER — Ambulatory Visit
Admission: RE | Admit: 2019-02-09 | Discharge: 2019-02-09 | Disposition: A | Discharge status: Home / Self Care | Payer: BC Managed Care – PPO | Source: Ambulatory Visit | Attending: Radiation Oncology | Admitting: Radiation Oncology

## 2019-02-09 ENCOUNTER — Other Ambulatory Visit: Payer: Self-pay

## 2019-02-09 DIAGNOSIS — Z510 Encounter for antineoplastic radiation therapy: Secondary | ICD-10-CM | POA: Diagnosis not present

## 2019-02-12 ENCOUNTER — Ambulatory Visit
Admission: RE | Admit: 2019-02-12 | Discharge: 2019-02-12 | Disposition: A | Discharge status: Home / Self Care | Payer: BC Managed Care – PPO | Source: Ambulatory Visit | Attending: Radiation Oncology | Admitting: Radiation Oncology

## 2019-02-12 ENCOUNTER — Other Ambulatory Visit: Payer: Self-pay

## 2019-02-12 DIAGNOSIS — Z510 Encounter for antineoplastic radiation therapy: Secondary | ICD-10-CM | POA: Diagnosis not present

## 2019-02-13 ENCOUNTER — Other Ambulatory Visit: Payer: Self-pay

## 2019-02-13 ENCOUNTER — Ambulatory Visit
Admission: RE | Admit: 2019-02-13 | Discharge: 2019-02-13 | Disposition: A | Discharge status: Home / Self Care | Payer: BC Managed Care – PPO | Source: Ambulatory Visit | Attending: Radiation Oncology | Admitting: Radiation Oncology

## 2019-02-13 ENCOUNTER — Inpatient Hospital Stay: Payer: BC Managed Care – PPO | Admitting: Nutrition

## 2019-02-13 DIAGNOSIS — Z510 Encounter for antineoplastic radiation therapy: Secondary | ICD-10-CM | POA: Diagnosis not present

## 2019-02-13 NOTE — Progress Notes (Signed)
RD working remotely.  Nutrition follow up completed with patient receiving radiation therapy for left parotid cancer. Patient reports weight is up 2 pounds on his home scale from 155 # to 157 #. States he continues to eat well and has a good appetite. He has increased his protein intake. Denies N, V, C, and D. He is in good spirits. States he is feeling a little dry mouth but is using a baking soda and salt water rinse which is effective.  Nutrition Diagnosis: Predicted suboptimal intake continues  Intervention: Educated patient to continue strategies for increasing calories and protein for weight maintenance.  Monitoring, Evaluation, Goals: Weight maintenance  Next Visit: Tuesday, April 7 by phone.

## 2019-02-14 ENCOUNTER — Ambulatory Visit
Admission: RE | Admit: 2019-02-14 | Discharge: 2019-02-14 | Disposition: A | Discharge status: Home / Self Care | Payer: BC Managed Care – PPO | Source: Ambulatory Visit | Attending: Radiation Oncology | Admitting: Radiation Oncology

## 2019-02-14 ENCOUNTER — Other Ambulatory Visit: Payer: Self-pay

## 2019-02-14 DIAGNOSIS — C07 Malignant neoplasm of parotid gland: Secondary | ICD-10-CM | POA: Insufficient documentation

## 2019-02-14 DIAGNOSIS — Z510 Encounter for antineoplastic radiation therapy: Secondary | ICD-10-CM | POA: Insufficient documentation

## 2019-02-15 ENCOUNTER — Ambulatory Visit
Admission: RE | Admit: 2019-02-15 | Discharge: 2019-02-15 | Disposition: A | Discharge status: Home / Self Care | Payer: BC Managed Care – PPO | Source: Ambulatory Visit | Attending: Radiation Oncology | Admitting: Radiation Oncology

## 2019-02-15 ENCOUNTER — Other Ambulatory Visit: Payer: Self-pay

## 2019-02-15 DIAGNOSIS — Z510 Encounter for antineoplastic radiation therapy: Secondary | ICD-10-CM | POA: Diagnosis not present

## 2019-02-16 ENCOUNTER — Ambulatory Visit
Admission: RE | Admit: 2019-02-16 | Discharge: 2019-02-16 | Disposition: A | Discharge status: Home / Self Care | Payer: BC Managed Care – PPO | Source: Ambulatory Visit | Attending: Radiation Oncology | Admitting: Radiation Oncology

## 2019-02-16 ENCOUNTER — Other Ambulatory Visit: Payer: Self-pay

## 2019-02-16 DIAGNOSIS — Z510 Encounter for antineoplastic radiation therapy: Secondary | ICD-10-CM | POA: Diagnosis not present

## 2019-02-19 ENCOUNTER — Other Ambulatory Visit: Payer: Self-pay

## 2019-02-19 ENCOUNTER — Ambulatory Visit
Admission: RE | Admit: 2019-02-19 | Discharge: 2019-02-19 | Disposition: A | Discharge status: Home / Self Care | Payer: BC Managed Care – PPO | Source: Ambulatory Visit | Attending: Radiation Oncology | Admitting: Radiation Oncology

## 2019-02-19 DIAGNOSIS — Z510 Encounter for antineoplastic radiation therapy: Secondary | ICD-10-CM | POA: Diagnosis not present

## 2019-02-20 ENCOUNTER — Ambulatory Visit
Admission: RE | Admit: 2019-02-20 | Discharge: 2019-02-20 | Disposition: A | Discharge status: Home / Self Care | Payer: BC Managed Care – PPO | Source: Ambulatory Visit | Attending: Radiation Oncology | Admitting: Radiation Oncology

## 2019-02-20 ENCOUNTER — Other Ambulatory Visit: Payer: Self-pay

## 2019-02-20 ENCOUNTER — Inpatient Hospital Stay: Payer: BC Managed Care – PPO | Attending: Radiation Oncology | Admitting: Nutrition

## 2019-02-20 DIAGNOSIS — Z510 Encounter for antineoplastic radiation therapy: Secondary | ICD-10-CM | POA: Diagnosis not present

## 2019-02-20 NOTE — Progress Notes (Signed)
RD working remotely.  F/U completed with patient who continues to eat well and maintain weight. States he is a bit more tired this week and he feels "discomfort" at the site of radiation however, it does not affect his ability to swallow. He continues to exercise daily and is working on a couple projects at home.  Nutrition Diagnosis: Predicted suboptimal intake continues  Intervention: Continue adequate calories and protein.  Monitoring, Evaluation, Goals: Weight maintenance  Follow up on Monday, April 20 by phone. Patient has my contact number.

## 2019-02-21 ENCOUNTER — Ambulatory Visit
Admission: RE | Admit: 2019-02-21 | Discharge: 2019-02-21 | Disposition: A | Discharge status: Home / Self Care | Payer: BC Managed Care – PPO | Source: Ambulatory Visit | Attending: Radiation Oncology | Admitting: Radiation Oncology

## 2019-02-21 ENCOUNTER — Other Ambulatory Visit: Payer: Self-pay

## 2019-02-21 DIAGNOSIS — Z510 Encounter for antineoplastic radiation therapy: Secondary | ICD-10-CM | POA: Diagnosis not present

## 2019-02-22 ENCOUNTER — Ambulatory Visit
Admission: RE | Admit: 2019-02-22 | Discharge: 2019-02-22 | Disposition: A | Discharge status: Home / Self Care | Payer: BC Managed Care – PPO | Source: Ambulatory Visit | Attending: Radiation Oncology | Admitting: Radiation Oncology

## 2019-02-22 ENCOUNTER — Other Ambulatory Visit: Payer: Self-pay

## 2019-02-22 DIAGNOSIS — Z510 Encounter for antineoplastic radiation therapy: Secondary | ICD-10-CM | POA: Diagnosis not present

## 2019-02-23 ENCOUNTER — Ambulatory Visit
Admission: RE | Admit: 2019-02-23 | Discharge: 2019-02-23 | Disposition: A | Discharge status: Home / Self Care | Payer: BC Managed Care – PPO | Source: Ambulatory Visit | Attending: Radiation Oncology | Admitting: Radiation Oncology

## 2019-02-23 ENCOUNTER — Other Ambulatory Visit: Payer: Self-pay

## 2019-02-23 DIAGNOSIS — Z510 Encounter for antineoplastic radiation therapy: Secondary | ICD-10-CM | POA: Diagnosis not present

## 2019-02-26 ENCOUNTER — Ambulatory Visit
Admission: RE | Admit: 2019-02-26 | Discharge: 2019-02-26 | Disposition: A | Discharge status: Home / Self Care | Payer: BC Managed Care – PPO | Source: Ambulatory Visit | Attending: Radiation Oncology | Admitting: Radiation Oncology

## 2019-02-26 ENCOUNTER — Other Ambulatory Visit: Payer: Self-pay

## 2019-02-26 DIAGNOSIS — Z510 Encounter for antineoplastic radiation therapy: Secondary | ICD-10-CM | POA: Diagnosis not present

## 2019-02-27 ENCOUNTER — Encounter: Payer: Self-pay | Admitting: Nutrition

## 2019-02-27 ENCOUNTER — Ambulatory Visit
Admission: RE | Admit: 2019-02-27 | Discharge: 2019-02-27 | Disposition: A | Discharge status: Home / Self Care | Payer: BC Managed Care – PPO | Source: Ambulatory Visit | Attending: Radiation Oncology | Admitting: Radiation Oncology

## 2019-02-27 DIAGNOSIS — Z510 Encounter for antineoplastic radiation therapy: Secondary | ICD-10-CM | POA: Diagnosis not present

## 2019-02-28 ENCOUNTER — Ambulatory Visit
Admission: RE | Admit: 2019-02-28 | Discharge: 2019-02-28 | Disposition: A | Discharge status: Home / Self Care | Payer: BC Managed Care – PPO | Source: Ambulatory Visit | Attending: Radiation Oncology | Admitting: Radiation Oncology

## 2019-02-28 ENCOUNTER — Other Ambulatory Visit: Payer: Self-pay

## 2019-02-28 DIAGNOSIS — Z510 Encounter for antineoplastic radiation therapy: Secondary | ICD-10-CM | POA: Diagnosis not present

## 2019-03-01 ENCOUNTER — Ambulatory Visit
Admission: RE | Admit: 2019-03-01 | Discharge: 2019-03-01 | Disposition: A | Discharge status: Home / Self Care | Payer: BC Managed Care – PPO | Source: Ambulatory Visit | Attending: Radiation Oncology | Admitting: Radiation Oncology

## 2019-03-01 ENCOUNTER — Other Ambulatory Visit: Payer: Self-pay

## 2019-03-01 DIAGNOSIS — Z510 Encounter for antineoplastic radiation therapy: Secondary | ICD-10-CM | POA: Diagnosis not present

## 2019-03-02 ENCOUNTER — Other Ambulatory Visit: Payer: Self-pay

## 2019-03-02 ENCOUNTER — Ambulatory Visit
Admission: RE | Admit: 2019-03-02 | Discharge: 2019-03-02 | Disposition: A | Discharge status: Home / Self Care | Payer: BC Managed Care – PPO | Source: Ambulatory Visit | Attending: Radiation Oncology | Admitting: Radiation Oncology

## 2019-03-02 DIAGNOSIS — Z510 Encounter for antineoplastic radiation therapy: Secondary | ICD-10-CM | POA: Diagnosis not present

## 2019-03-05 ENCOUNTER — Other Ambulatory Visit: Payer: Self-pay | Admitting: Radiation Oncology

## 2019-03-05 ENCOUNTER — Other Ambulatory Visit: Payer: Self-pay

## 2019-03-05 ENCOUNTER — Ambulatory Visit
Admission: RE | Admit: 2019-03-05 | Discharge: 2019-03-05 | Disposition: A | Discharge status: Home / Self Care | Payer: BC Managed Care – PPO | Source: Ambulatory Visit | Attending: Radiation Oncology | Admitting: Radiation Oncology

## 2019-03-05 ENCOUNTER — Encounter: Payer: Self-pay | Admitting: Radiation Oncology

## 2019-03-05 ENCOUNTER — Inpatient Hospital Stay: Payer: BC Managed Care – PPO | Admitting: Nutrition

## 2019-03-05 DIAGNOSIS — Z510 Encounter for antineoplastic radiation therapy: Secondary | ICD-10-CM | POA: Diagnosis not present

## 2019-03-05 DIAGNOSIS — C07 Malignant neoplasm of parotid gland: Secondary | ICD-10-CM

## 2019-03-05 MED ORDER — SONAFINE EX EMUL
1.0000 "application " | Freq: Once | CUTANEOUS | Status: AC
  Administered 2019-03-05: 1 via TOPICAL

## 2019-03-05 NOTE — Progress Notes (Signed)
RD working remotely.  Patient contacted for final nutrition follow up for left parotid cancer. He did not answer and I was unable to leave a message. Patient has my contact information if needed.

## 2019-03-20 ENCOUNTER — Other Ambulatory Visit: Payer: Self-pay | Admitting: Radiation Oncology

## 2019-03-20 DIAGNOSIS — C07 Malignant neoplasm of parotid gland: Secondary | ICD-10-CM

## 2019-03-20 NOTE — Progress Notes (Signed)
I called the patient today about their upcoming follow-up appointment in radiation oncology.   Given concerns about the COVID-19 pandemic, I offered a phone assessment with the patient to determine if coming to the clinic was necessary. The patient accepted.  I let the patient know that I had spoken with Dr. Isidore Moos, and she wanted them to know the importance of washing their hands for at least 20 seconds at a time, especially after going out in public, and before they eat. Limit going out in public whenever possible. Do not touch your face, unless your hands are clean, such as when bathing. Get plenty of rest, eat well, and stay hydrated.   Symptomatically, the patient is doing relatively well. They report improved energy, skin healing at radiation site, and eating and drinking appropriately despite continued taste changes.   All questions were answered to the patient's satisfaction.  I encouraged the patient to call with any further questions. Otherwise, the plan is to follow up with Dr. Isidore Moos in 3 months for physical assessment and imaging results.    Patient is pleased with this plan, and we will cancel their upcoming follow-up to reduce the risk of COVID-19 transmission.

## 2019-03-21 ENCOUNTER — Ambulatory Visit
Admission: RE | Admit: 2019-03-21 | Discharge: 2019-03-21 | Disposition: A | Discharge status: Home / Self Care | Payer: BC Managed Care – PPO | Source: Ambulatory Visit | Attending: Radiation Oncology | Admitting: Radiation Oncology

## 2019-03-22 NOTE — Progress Notes (Signed)
  Patient Name: Christopher Pacheco MRN: [id number] DOB: 08-25-1961 Referring Physician: Melony Overly (Profile Not Attached) Date of Service: 03/05/2019 [facility], [place]                                                        End Of Treatment Note  Diagnoses: C07-Malignant neoplasm of parotid gland  Cancer Staging Cancer of parotid gland [facility]) Staging form: Major Salivary Glands, AJCC 8th Edition - Pathologic stage from 01/09/2019: Stage II (pT2, pN0, cM0) - Signed by [name], MD on 01/09/2019  Intent: Curative  Radiation Treatment Dates: 01/23/2019 through 03/05/2019 Site Technique Total Dose Dose per Fx Completed Fx Beam Energies  Head & neck: HN_Lt_parotid IMRT 60/60 2 30/30 6X   Narrative: The patient tolerated radiation therapy relatively well, with mild fatigue. He is eating and drinking well but has lost his taste for most foods. The skin in the radiation fields is erythematous with dry desquamation, and he also has mild moist desquamation over the left earlobe. He is using Sonafine and was advised to apply Neosporin over the area of moist desquamation. He was also noted to have very mild lymphedema of the anterior neck.  Plan: The patient will follow-up with radiation oncology in 2-4 weeks. Restaging scans scheduled for July 27th. Patient will follow up with me the day after to review those results. Patient wishes to see a physical therapist for his neck lymphedema. Will make that referral.  ________________________________________________  [name], MD  This document serves as a record of services personally performed by [name], MD. It was created on her behalf by Rae Lips, a trained medical scribe. The creation of this record is based on the scribe's personal observations and the  provider's statements to them. This document has been checked and approved by the attending provider.

## 2019-04-11 ENCOUNTER — Other Ambulatory Visit: Payer: Self-pay

## 2019-04-11 ENCOUNTER — Ambulatory Visit: Payer: BC Managed Care – PPO | Attending: Radiation Oncology | Admitting: Physical Therapy

## 2019-04-11 DIAGNOSIS — R293 Abnormal posture: Secondary | ICD-10-CM | POA: Diagnosis present

## 2019-04-11 DIAGNOSIS — I890 Lymphedema, not elsewhere classified: Secondary | ICD-10-CM | POA: Insufficient documentation

## 2019-04-11 NOTE — Therapy (Signed)
[place], [place], [address] Phone: [phone]   Fax:  [phone]  Physical Therapy Evaluation  Patient Details  Name: Christopher Pacheco MRN: [id number] Date of Birth: 1961/09/21 Referring Provider (PT): Reita May Date: 04/11/2019  PT End of Session - 04/11/19 1849    Visit Number  1    Number of Visits  9    Date for PT Re-Evaluation  05/12/19    PT Start Time  1410    PT Stop Time  1450    PT Time Calculation (min)  40 min    Activity Tolerance  Patient tolerated treatment well    Behavior During Therapy  [facility] for tasks assessed/performed       Past Medical History:  Diagnosis Date  . Allergy   . Diffuse infection of pancreas    30 years ago  . Diverticulitis   . Fatty liver    Dr. Watt Climes  . Medical history non-contributory     Past Surgical History:  Procedure Laterality Date  . COLONOSCOPY    . PAROTIDECTOMY Left 12/01/2018   Procedure: PAROTIDECTOMY;  Surgeon: [name], MD;  Location: [place];  Service: ENT;  Laterality: Left;    There were no vitals filed for this visit.   Subjective Assessment - 04/11/19 1557    Subjective  Pt states he is having some fullness under his chin. He had some weakness in left forehead and at corner of left mouth , but that is improved and he doesn't think he needs treatment for that.     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     Currently in Pain?  No/denies         [facility] PT Assessment - 04/11/19 0001      Assessment   Medical Diagnosis  left parotid cancer     Referring Provider (PT)  Isidore Moos     Onset Date/Surgical Date  12/01/18    Hand Dominance  Left      Balance Screen   Has the patient fallen in the past 6 months  No    Has the patient had a decrease in activity level because of a fear of falling?   No    Is the patient reluctant to  leave their home because of a fear of falling?   No      Home Film/video editor residence      Prior Function   Level of Independence  Independent    Vocation  Full time employment    Vocation Requirements  college professor     Leisure  walks now, wants to get back to swimming once pools reopen       Cognition   Overall Cognitive Status  Within Functional Limits for tasks assessed      Observation/Other Assessments   Observations  pt with well healed scar at left lateral neck with firmness surrounding it, darkening of skin also       Observation/Other Assessments-Edema    Edema  --   visible lymphedema under chin     Coordination   Gross Motor Movements are Fluid and Coordinated  Yes      Posture/Postural Control   Posture/Postural Control  Postural limitations    Postural Limitations  Rounded Shoulders;Forward head      ROM / Strength   AROM / PROM /  Strength  AROM      AROM   Overall AROM Comments  cervial ROM is WNL pt has been doing exercise at home         LYMPHEDEMA/ONCOLOGY QUESTIONNAIRE - 04/11/19 1439      Treatment   Past Radiation Treatment  Yes      What other symptoms do you have   Are you Having Heaviness or Tightness  No    Is it Hard or Difficult finding clothes that fit  No      Head and Neck   4 cm superior to sternal notch around neck  41.2 cm    6 cm superior to sternal notch around neck  40.5 cm    8 cm superior to sternal notch around neck  40.8 cm    Other  widest part of neck 41             Objective measurements completed on examination: See above findings.      [place] Adult PT Treatment/Exercise - 04/11/19 0001      Self-Care   Self-Care  Other Self-Care Comments    Other Self-Care Comments   lengthy discussion of  anatomy and physiology of lymph system and details of complete decongestive therapy.  Also showed pt Marena chin strap on [employer] as a potential compression garment.  Gave pt a chip pack to  tie on top of head to try              PT Education - 04/11/19 1848    Education Details  Wear chip pack at full area under chin at least 2 hours a day or as much as possible     Person(s) Educated  Patient;Spouse    Methods  Explanation    Comprehension  Verbalized understanding;Returned demonstration          PT Long Term Goals - 04/11/19 1856      PT LONG TERM GOAL #1   Title  Pt / wife will be independent in MLD and use of compression to manage lymphedema     Time  4    Period  Weeks    Status  New      PT LONG TERM GOAL #2   Title  Pt will have a decrease of .5 cm around neck circumference  when measured at widest point     Time  [place] - 04/11/19 1850    Clinical Impression Statement  Pt comes to PT seeking help for lymphedema that is collecting under his chin.  His wife is will him today and both were very attentive and asking appropriate questions about treatment.  He is doing well with range of motion exercises that he learned at head and neck clinic and will benefit from manual lymph drainage and compression.     Personal Factors and Comorbidities  Past/Current Experience;Comorbidity 2    Comorbidities  surgery and radiation     Stability/Clinical Decision Making  Stable/Uncomplicated    Clinical Decision Making  Low    Rehab Potential  Good    PT Frequency  2x / week   2x/wk for 2 weeks, then one time a week    PT Duration  4 weeks    PT Treatment/Interventions  ADLs/Self Care Home Management;Patient/family education;Orthotic Fit/Training;Manual lymph drainage;Compression bandaging;Taping;Scar mobilization    PT Next Visit Plan  begin and teach MLD to head and neck, check to see if chip pack is helping, perhaps help order marena garment consider trial of kinstiotape     Consulted and Agree with Plan of Care  Patient;Family member/caregiver    Family Member Consulted  wife       Patient will benefit from  skilled therapeutic intervention in order to improve the following deficits and impairments:  Increased edema, Decreased knowledge of precautions, Decreased knowledge of use of DME, Increased fascial restricitons, Postural dysfunction  Visit Diagnosis: Lymphedema, not elsewhere classified - Plan: PT plan of care cert/re-cert  Abnormal posture - Plan: PT plan of care cert/re-cert     Problem List Patient Active Problem List   Diagnosis Date Noted  . Cancer of parotid gland ([place]) 01/09/2019  . Parotid mass 12/01/2018   Donato Heinz. Owens Shark PT  [place] Levo 04/11/2019, 7:00 PM  [place], [place], [address] Phone: [phone]   Fax:  [phone]  Name: Christopher Pacheco MRN: [id number] Date of Birth: 07-Jun-1961

## 2019-04-19 ENCOUNTER — Other Ambulatory Visit: Payer: Self-pay

## 2019-04-19 ENCOUNTER — Encounter: Payer: Self-pay | Admitting: Physical Therapy

## 2019-04-19 ENCOUNTER — Ambulatory Visit: Payer: BC Managed Care – PPO | Attending: Radiation Oncology | Admitting: Physical Therapy

## 2019-04-19 DIAGNOSIS — R293 Abnormal posture: Secondary | ICD-10-CM | POA: Diagnosis present

## 2019-04-19 DIAGNOSIS — I890 Lymphedema, not elsewhere classified: Secondary | ICD-10-CM

## 2019-04-19 NOTE — Therapy (Signed)
[place], [place], [address] Phone: [phone]   Fax:  [phone]  Physical Therapy Treatment  Patient Details  Name: Christopher Pacheco MRN: [id number] Date of Birth: 1961-04-27 Referring Provider (PT): Reita May Date: 04/19/2019  PT End of Session - 04/19/19 1555    Visit Number  2    Number of Visits  9    Date for PT Re-Evaluation  05/12/19    PT Start Time  1500    PT Stop Time  1547    PT Time Calculation (min)  47 min    Activity Tolerance  Patient tolerated treatment well    Behavior During Therapy  [facility] for tasks assessed/performed       Past Medical History:  Diagnosis Date  . Allergy   . Diffuse infection of pancreas    30 years ago  . Diverticulitis   . Fatty liver    Dr. Watt Climes  . Medical history non-contributory     Past Surgical History:  Procedure Laterality Date  . COLONOSCOPY    . PAROTIDECTOMY Left 12/01/2018   Procedure: PAROTIDECTOMY;  Surgeon: [name], MD;  Location: [place];  Service: ENT;  Laterality: Left;    There were no vitals filed for this visit.  Subjective Assessment - 04/19/19 1505    Subjective  I have been wearing the chip pack every 2 hours. I can not tell a difference yet but I dont think it has been enough time.     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  No/denies                       [facility] Adult PT Treatment/Exercise - 04/19/19 0001      Manual Therapy   Manual Therapy  Manual Lymphatic Drainage (MLD)    Manual Lymphatic Drainage (MLD)  Instructed pt and wife in correct technique and sequence and had pt's wife return demonstrate: with HOB slightly elevated: 5 diaphragmatic breaths, bilateral axillary nodes, bilateral pectoral nodes, short neck, drainage of  posterior and lateral neck, drainage of anterior neck moving fluid downwards then retracing all steps                  PT Long Term Goals - 04/11/19 1856      PT LONG TERM GOAL #1   Title  Pt / wife will be independent in MLD and use of compression to manage lymphedema     Time  4    Period  Weeks    Status  New      PT LONG TERM GOAL #2   Title  Pt will have a decrease of .5 cm around neck circumference  when measured at widest point     Time  4    Period  [place] - 04/19/19 1556    Clinical Impression Statement  Began instructing pt and spouse in head and neck MLD and issued handout. Had pt's wife return demonstrate correct technique and answered questions. Educated pt on anatomy and physiolgy principles of the lymphatic system. Will instruct pt in seated position in front of the mirror next time so he can see correct way  to perform massage. Educated pt to bring chip pack to next visit to wear after MLD.     Rehab Potential  Good    PT Frequency  2x / week   2/wk for 2 wk then decreasing to 1x/wk   PT Duration  4 weeks    PT Treatment/Interventions  ADLs/Self Care Home Management;Patient/family education;Orthotic Fit/Training;Manual lymph drainage;Compression bandaging;Taping;Scar mobilization    PT Next Visit Plan   teach MLD head and neck, check to see if chip pack is helping, perhaps help order marena garment consider trial of kinstiotape     Consulted and Agree with Plan of Care  Patient    Family Member Consulted  wife       Patient will benefit from skilled therapeutic intervention in order to improve the following deficits and impairments:  Increased edema, Decreased knowledge of precautions, Decreased knowledge of use of DME, Increased fascial restricitons, Postural dysfunction  Visit Diagnosis: Lymphedema, not elsewhere classified     Problem List Patient Active Problem List   Diagnosis Date Noted  . Cancer of parotid  gland ([place]) 01/09/2019  . Parotid mass 12/01/2018    Allyson Sabal [facility] 04/19/2019, 3:59 PM  [place], [place], [address] Phone: [phone]   Fax:  [phone]  Name: COURTENAY CREGER MRN: [id number] Date of Birth: [birth year]  Manus Gunning, PT 04/19/19 3:59 PM

## 2019-04-20 ENCOUNTER — Encounter: Payer: BC Managed Care – PPO | Admitting: Physical Therapy

## 2019-04-24 ENCOUNTER — Ambulatory Visit: Payer: BC Managed Care – PPO | Admitting: Physical Therapy

## 2019-04-24 ENCOUNTER — Other Ambulatory Visit: Payer: Self-pay

## 2019-04-24 ENCOUNTER — Encounter: Payer: Self-pay | Admitting: Physical Therapy

## 2019-04-24 DIAGNOSIS — I890 Lymphedema, not elsewhere classified: Secondary | ICD-10-CM | POA: Diagnosis not present

## 2019-04-24 NOTE — Therapy (Signed)
[place], [place], [address] Phone: [phone]   Fax:  [phone]  Physical Therapy Treatment  Patient Details  Name: Christopher Pacheco MRN: [id number] Date of Birth: 19-Jul-1961 Referring Provider (PT): Reita May Date: 04/24/2019  PT End of Session - 04/24/19 1452    Visit Number  3    Number of Visits  9    Date for PT Re-Evaluation  05/12/19    PT Start Time  6629    PT Stop Time  1450    PT Time Calculation (min)  47 min    Activity Tolerance  Patient tolerated treatment well    Behavior During Therapy  [facility] for tasks assessed/performed       Past Medical History:  Diagnosis Date  . Allergy   . Diffuse infection of pancreas    30 years ago  . Diverticulitis   . Fatty liver    Dr. Watt Climes  . Medical history non-contributory     Past Surgical History:  Procedure Laterality Date  . COLONOSCOPY    . PAROTIDECTOMY Left 12/01/2018   Procedure: PAROTIDECTOMY;  Surgeon: [name], MD;  Location: [place];  Service: ENT;  Laterality: Left;    There were no vitals filed for this visit.  Subjective Assessment - 04/24/19 1405    Subjective  The chip pack is still helping. We have been trying the massage but not sure I am doing it right.     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  No/denies                       [facility] Adult PT Treatment/Exercise - 04/24/19 0001      Manual Therapy   Manual Therapy  Manual Lymphatic Drainage (MLD)    Manual Lymphatic Drainage (MLD)  Instructed pt and wife in correct technique and sequence and had pt's wife and pt return demonstrate: In sitting in front on mirror so pt could see: 5 diaphragmatic breaths, bilateral axillary nodes, bilateral pectoral nodes, short neck, drainage of  posterior and lateral neck, drainage of anterior neck moving fluid downwards then retracing all steps                  PT Long Term Goals - 04/11/19 1856      PT LONG TERM GOAL #1   Title  Pt / wife will be independent in MLD and use of compression to manage lymphedema     Time  4    Period  Weeks    Status  New      PT LONG TERM GOAL #2   Title  Pt will have a decrease of .5 cm around neck circumference  when measured at widest point     Time  4    Period  Weeks    Status  New            Plan - 04/24/19 1453    Clinical Impression Statement  Continued instructing pt and spouse in head and neck MLD today. They have tried it over the weekend and has nearly memorized the sequence. Answered questions about correct hand placement and direction of stretch. They are still requring verbal cues for correct technique but it is improving and they are progressing  towards independence.     Rehab Potential  Good    PT Frequency  2x / week    PT Duration  4 weeks    PT Treatment/Interventions  ADLs/Self Care Home Management;Patient/family education;Orthotic Fit/Training;Manual lymph drainage;Compression bandaging;Taping;Scar mobilization    PT Next Visit Plan   continue to teach MLD head and neck, check to see if chip pack is helping, perhaps help order marena garment consider trial of kinstiotape     Consulted and Agree with Plan of Care  Patient    Family Member Consulted  wife       Patient will benefit from skilled therapeutic intervention in order to improve the following deficits and impairments:  Increased edema, Decreased knowledge of precautions, Decreased knowledge of use of DME, Increased fascial restricitons, Postural dysfunction  Visit Diagnosis: Lymphedema, not elsewhere classified     Problem List Patient Active Problem List   Diagnosis Date Noted  . Cancer of parotid gland ([place]) 01/09/2019  . Parotid mass 12/01/2018    Allyson Sabal [facility] 04/24/2019,  2:55 PM  [place], [place], [address] Phone: [phone]   Fax:  [phone]  Name: Christopher Pacheco MRN: [id number] Date of Birth: [birth year]  Manus Gunning, PT 04/24/19 2:55 PM

## 2019-04-26 ENCOUNTER — Encounter: Payer: Self-pay | Admitting: Physical Therapy

## 2019-04-26 ENCOUNTER — Ambulatory Visit: Payer: BC Managed Care – PPO | Admitting: Physical Therapy

## 2019-04-26 ENCOUNTER — Other Ambulatory Visit: Payer: Self-pay

## 2019-04-26 DIAGNOSIS — I890 Lymphedema, not elsewhere classified: Secondary | ICD-10-CM

## 2019-04-26 DIAGNOSIS — R293 Abnormal posture: Secondary | ICD-10-CM

## 2019-04-26 NOTE — Therapy (Signed)
[place], [place], [address] Phone: [phone]   Fax:  [phone]  Physical Therapy Treatment  Patient Details  Name: Christopher Pacheco MRN: [id number] Date of Birth: 1960/12/13 Referring Provider (PT): Reita May Date: 04/26/2019  PT End of Session - 04/26/19 1453    Visit Number  4    Number of Visits  9    Date for PT Re-Evaluation  05/12/19    PT Start Time  6962    PT Stop Time  1450    PT Time Calculation (min)  45 min    Activity Tolerance  Patient tolerated treatment well    Behavior During Therapy  [facility] for tasks assessed/performed       Past Medical History:  Diagnosis Date  . Allergy   . Diffuse infection of pancreas    30 years ago  . Diverticulitis   . Fatty liver    Dr. Watt Climes  . Medical history non-contributory     Past Surgical History:  Procedure Laterality Date  . COLONOSCOPY    . PAROTIDECTOMY Left 12/01/2018   Procedure: PAROTIDECTOMY;  Surgeon: [name], MD;  Location: [place];  Service: ENT;  Laterality: Left;    There were no vitals filed for this visit.  Subjective Assessment - 04/26/19 1407    Subjective  I think the last time we were here it really helped confirmed that we were doing things correctly.    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  No/denies            LYMPHEDEMA/ONCOLOGY QUESTIONNAIRE - 04/26/19 1444      Head and Neck   4 cm superior to sternal notch around neck  39.6 cm    6 cm superior to sternal notch around neck  39 cm    8 cm superior to sternal notch around neck  40 cm                [facility] Adult PT Treatment/Exercise - 04/26/19 0001      Manual Therapy   Manual Therapy  Manual Lymphatic Drainage (MLD);Edema management    Edema Management  educated pt  about JoviPak head and neck garment and gave them info to decide if they want to persue that    Manual Lymphatic Drainage (MLD)  Instructed pt and wife in correct technique and sequence In supine: 5 diaphragmatic breaths, bilateral axillary nodes, bilateral pectoral nodes, short neck, drainage of posterior and lateral neck, drainage of anterior neck moving fluid downwards then retracing all steps                  PT Long Term Goals - 04/26/19 1459      PT LONG TERM GOAL #1   Title  Pt / wife will be independent in MLD and use of compression to manage lymphedema     Baseline  pt and spouse are progressing towards indep but are not there quite yet, they still require verbal cueing 04/26/19    Time  4    Period  Weeks    Status  On-going    Target Date  05/24/19      PT LONG TERM GOAL #2   Title  Pt will have a decrease of .5 cm around neck circumference  when measured at widest point     Time  4    Period  Weeks    Status  Achieved      PT LONG TERM GOAL #3   Title  Pt will obtain appropriate head and neck garment that is appropriate to measure his lymphedema long term.    Time  4    Period  Weeks    Status  New    Target Date  05/24/19            Plan - 04/26/19 1454    Clinical Impression Statement  Continued with MLD and verbally instructing pt and his wife in correct technique. He does not feel the chip pack is helping much. Educated pt about JoviPak with added pad and issued info about this for them to research. He may want to get measured by [facility] for this. His circumference measurements have decreased since time of evaluation. He would benefit from an additonal 2x/wk for 4 weeks to continue to progress pt and his wife towards independence with self MLD and assist them with obtaining appropriate compression garments as needed.    Rehab Potential  Good    PT Frequency  2x / week    PT Duration  4 weeks    PT Treatment/Interventions  ADLs/Self Care Home  Management;Patient/family education;Orthotic Fit/Training;Manual lymph drainage;Compression bandaging;Taping;Scar mobilization    PT Next Visit Plan  see if they want to be signed up with [employer] to get measured for JoviPak with pad, continue to teach MLD head and neck, check to see if chip pack is helping, perhaps help order marena garment consider trial of kinstiotape    Consulted and Agree with Plan of Care  Patient    Family Member Consulted  wife       Patient will benefit from skilled therapeutic intervention in order to improve the following deficits and impairments:  Increased edema, Decreased knowledge of precautions, Decreased knowledge of use of DME, Increased fascial restricitons, Postural dysfunction  Visit Diagnosis: Lymphedema, not elsewhere classified - Plan: PT plan of care cert/re-cert,   Abnormal posture - Plan: PT plan of care cert/re-cert,      Problem List Patient Active Problem List   Diagnosis Date Noted  . Cancer of parotid gland ([place]) 01/09/2019  . Parotid mass 12/01/2018    Allyson Sabal [facility] 04/26/2019, 3:03 PM  [place], [place], [address] Phone: [phone]   Fax:  [phone]  Name: RAYVON DAKIN MRN: [id number] Date of Birth: July 24, 1961  Manus Gunning, PT 04/26/19 3:03 PM

## 2019-05-01 ENCOUNTER — Ambulatory Visit (HOSPITAL_COMMUNITY): Payer: PRIVATE HEALTH INSURANCE | Admitting: Dentistry

## 2019-05-01 ENCOUNTER — Ambulatory Visit: Payer: BC Managed Care – PPO | Admitting: Physical Therapy

## 2019-05-01 ENCOUNTER — Encounter (HOSPITAL_COMMUNITY): Payer: Self-pay | Admitting: Dentistry

## 2019-05-01 ENCOUNTER — Other Ambulatory Visit: Payer: Self-pay

## 2019-05-01 ENCOUNTER — Encounter: Payer: Self-pay | Admitting: Physical Therapy

## 2019-05-01 VITALS — BP 125/83 | HR 65 | Temp 98.2°F | Wt 152.0 lb

## 2019-05-01 DIAGNOSIS — M264 Malocclusion, unspecified: Secondary | ICD-10-CM

## 2019-05-01 DIAGNOSIS — C07 Malignant neoplasm of parotid gland: Secondary | ICD-10-CM

## 2019-05-01 DIAGNOSIS — I890 Lymphedema, not elsewhere classified: Secondary | ICD-10-CM | POA: Diagnosis not present

## 2019-05-01 DIAGNOSIS — K117 Disturbances of salivary secretion: Secondary | ICD-10-CM

## 2019-05-01 DIAGNOSIS — K08409 Partial loss of teeth, unspecified cause, unspecified class: Secondary | ICD-10-CM

## 2019-05-01 DIAGNOSIS — R432 Parageusia: Secondary | ICD-10-CM

## 2019-05-01 DIAGNOSIS — K0850 Unsatisfactory restoration of tooth, unspecified: Secondary | ICD-10-CM

## 2019-05-01 DIAGNOSIS — Z923 Personal history of irradiation: Secondary | ICD-10-CM | POA: Diagnosis not present

## 2019-05-01 DIAGNOSIS — R293 Abnormal posture: Secondary | ICD-10-CM

## 2019-05-01 NOTE — Patient Instructions (Addendum)
RECOMMENDATIONS: 1. Brush after meals and at bedtime.  Use fluoride at bedtime in the fluoride trays or alternate with fluoride on the toothbrush to increase compliance. 2. Use trismus exercises as directed. 3. Use Biotene Rinses or salt water/baking soda rinses. 4. Multiple sips of water as needed. 5. Return to the offices of Dr. Mathis Fare in the event that Dr. Ardyth Gal has retired.    Lenn Cal, DDS

## 2019-05-01 NOTE — Patient Instructions (Signed)
[email or web address]  Resourses  Self care videos  Head and Neck MLD

## 2019-05-01 NOTE — Therapy (Signed)
[place], [place], [address] Phone: [phone]   Fax:  [phone]  Physical Therapy Treatment  Patient Details  Name: Christopher Pacheco MRN: [id number] Date of Birth: 01-Aug-1961 Referring Provider (PT): Reita May Date: 05/01/2019  PT End of Session - 05/01/19 1609    Visit Number  5    Number of Visits  9    Date for PT Re-Evaluation  05/12/19    PT Start Time  1430    PT Stop Time  1530    PT Time Calculation (min)  60 min    Activity Tolerance  Patient tolerated treatment well    Behavior During Therapy  [facility] for tasks assessed/performed       Past Medical History:  Diagnosis Date  . Allergy   . Diffuse infection of pancreas    30 years ago  . Diverticulitis   . Fatty liver    Dr. Watt Climes  . Medical history non-contributory     Past Surgical History:  Procedure Laterality Date  . COLONOSCOPY    . PAROTIDECTOMY Left 12/01/2018   Procedure: PAROTIDECTOMY;  Surgeon: [name], MD;  Location: [place];  Service: ENT;  Laterality: Left;    There were no vitals filed for this visit.  Subjective Assessment - 05/01/19 1602    Subjective  Pt says he is doing well.  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  No/denies                       [facility] Adult PT Treatment/Exercise - 05/01/19 0001      Manual Therapy   Manual Therapy  Edema management;Manual Lymphatic Drainage (MLD);Taping    Edema Management  educated pt about JoviPak head and neck garment and gave them info., demographics sent to [facility]     Manual Lymphatic Drainage (MLD)  Reviewed and perfomred with pt and wife in correct technique and sequence In supine: 5 diaphragmatic breaths, bilateral  axillary nodes, bilateral pectoral nodes, short neck, drainage of posterior and lateral neck, drainage of anterior neck moving fluid downwards then retracing all steps    Kinesiotex  Edema      Kinesiotix   Edema  skinkote applied, the fan shaped kinesiotape from front of left neck to below collarbone, front of right neck back to right shoulder and extra piece with longitudinal openings at front of neck              PT Education - 05/01/19 1603    Education Details  Reviewed Head and Neck Lymphedema for self and gave information about link for video on [email or web address]    Person(s) Educated  Patient;Spouse    Methods  Explanation;Demonstration;Handout    Comprehension  Verbalized understanding          PT Long Term Goals - 04/26/19 1459      PT LONG TERM GOAL #1   Title  Pt / wife will be independent in MLD and use of compression to manage lymphedema     Baseline  pt and spouse are progressing towards indep but are not there quite yet, they still require verbal cueing 04/26/19    Time  4    Period  Weeks  Status  On-going    Target Date  05/24/19      PT LONG TERM GOAL #2   Title  Pt will have a decrease of .5 cm around neck circumference  when measured at widest point     Time  4    Period  Weeks    Status  Achieved      PT LONG TERM GOAL #3   Title  Pt will obtain appropriate head and neck garment that is appropriate to measure his lymphedema long term.    Time  4    Period  Weeks    Status  New    Target Date  05/24/19            Plan - 05/01/19 1610    Clinical Impression Statement  Extra time spent with wife and Pt and perfoming MLD.  Wife, especially , has many questions.  Gave pt link to a video on [email or web address] and they were happy with that.  Pt wants to find out about coverage for jovipak garment.  Added kinesiotape to see if pt will get relief form visible soft fullness at neck    Rehab Potential  Good    PT Frequency  2x / week    PT  Duration  4 weeks    PT Treatment/Interventions  ADLs/Self Care Home Management;Patient/family education;Orthotic Fit/Training;Manual lymph drainage;Compression bandaging;Taping;Scar mobilization    PT Next Visit Plan  see if kinesiotap was effecteive  continue to teach/perform  MLD head and neck, check to see if pt has heard about insurance coverage from [place]  wife       Patient will benefit from skilled therapeutic intervention in order to improve the following deficits and impairments:  Increased edema, Decreased knowledge of precautions, Decreased knowledge of use of DME, Increased fascial restricitons, Postural dysfunction  Visit Diagnosis: 1. Lymphedema, not elsewhere classified   2. Abnormal posture        Problem List Patient Active Problem List   Diagnosis Date Noted  . Cancer of parotid gland ([place]) 01/09/2019  . Parotid mass 12/01/2018  Donato Heinz. Owens Shark PT  [place] [facility] 05/01/2019, [place], [place], [address] Phone: [phone]   Fax:  [phone]  Name: Christopher Pacheco MRN: [id number] Date of Birth: 10-20-1961

## 2019-05-01 NOTE — Progress Notes (Signed)
05/01/2019  Patient Name:   Christopher Pacheco Date of Birth:   Apr 22, 1961 Medical Record Number: [id number]  BP 125/83 (BP Location: Right Arm)   Pulse 65   Temp 98.2 F (36.8 C)   Wt 152 lb (68.9 kg)   BMI 21.81 kg/m   [name] presents for oral examination after radiation therapy. Patient has completed all radiation treatments from 01/23/2019 through 03/05/2019. No chemotherapy.  REVIEW OF CHIEF COMPLAINTS: DRY MOUTH: Yes HARD TO SWALLOW: No  HURT TO SWALLOW: No. TASTE CHANGES: Taste is returning. SORES IN MOUTH: Denies sores in his mouth TRISMUS: Denies problems with trismus symptoms WEIGHT: 152 pounds started treatment at 155 lbs. HOME OH REGIMEN:  BRUSHING: twice a day FLOSSING: once a day RINSING: No rinses. FLUORIDE: Using fluoride at bedtime in the fluoride trays TRISMUS EXERCISES:  Maximum interincisal opening: 35 mm   DENTAL EXAM:  Oral Hygiene:(PLAQUE): Good oral hygiene. LOCATION OF MUCOSITIS: None noted DESCRIPTION OF SALIVA: Decreased saliva. Mild xerostomia. ANY EXPOSED BONE: None noted OTHER WATCHED AREAS: Persistent periapical radiolucency at the apex of tooth #29, fractured porcelain on the crown of tooth #19, and missing space in the area of the impacted tooth #11. DX:  1. Xerostomia 2. Dysgeusia 3. Persistent periapical radiolucency at the apex to the 29 4. Fractured porcelain on the PFM crown of tooth #19 5. Missing tooth in the area of the impacted tooth #11  RECOMMENDATIONS: 1. Brush after meals and at bedtime.  Use fluoride at bedtime in the fluoride trays or alternate with fluoride on the toothbrush to increase compliance. 2. Use trismus exercises as directed. 3. Use Biotene Rinses or salt water/baking soda rinses. 4. Multiple sips of water as needed. 5. Return to the offices of Dr. Mathis Fare in the event that Dr. Ardyth Gal has retired.    Lenn Cal, DDS

## 2019-05-03 ENCOUNTER — Other Ambulatory Visit: Payer: Self-pay

## 2019-05-03 ENCOUNTER — Ambulatory Visit: Payer: BC Managed Care – PPO

## 2019-05-03 DIAGNOSIS — I890 Lymphedema, not elsewhere classified: Secondary | ICD-10-CM

## 2019-05-03 DIAGNOSIS — R293 Abnormal posture: Secondary | ICD-10-CM

## 2019-05-03 NOTE — Therapy (Signed)
[place], [place], [address] Phone: [phone]   Fax:  [phone]  Physical Therapy Treatment  Patient Details  Name: Christopher Pacheco MRN: [id number] Date of Birth: 1961/03/02 Referring Provider (PT): Reita May Date: 05/03/2019  PT End of Session - 05/03/19 1502    Visit Number  6    Number of Visits  9    Date for PT Re-Evaluation  05/12/19    PT Start Time  2979    PT Stop Time  1450    PT Time Calculation (min)  47 min    Activity Tolerance  Patient tolerated treatment well    Behavior During Therapy  [facility] for tasks assessed/performed       Past Medical History:  Diagnosis Date  . Allergy   . Diffuse infection of pancreas    30 years ago  . Diverticulitis   . Fatty liver    Dr. Watt Climes  . Medical history non-contributory     Past Surgical History:  Procedure Laterality Date  . COLONOSCOPY    . PAROTIDECTOMY Left 12/01/2018   Procedure: PAROTIDECTOMY;  Surgeon: [name], MD;  Location: [facility];  Service: ENT;  Laterality: Left;    There were no vitals filed for this visit.  Subjective Assessment - 05/03/19 1415    Subjective  The tape was fine, it didn't bother me. Can't tell if it made a difference yet or not.    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     Patient Stated Goals  to decrease swelling                       [facility] Adult PT Treatment/Exercise - 05/03/19 0001      Manual Therapy   Manual Lymphatic Drainage (MLD)  Reviewed and performed with pt and wife in correct technique and sequence In supine: Short neck, supraclavicular fossa, bil shoulder collectors and bil axillary nodes, 5 diaphragmatic breaths, bil intact upper quadrant sequence, drainage of posterior and lateral neck, drainage of anterior neck moving fluid downwards then retracing all steps having  wife return some demo with hand over hand pressure      Kinesiotix   Edema  Removed pts tape today placed at last session so did not reapply today but did issue and instruct pt and wife a piece cut into 4 fingers to apply near medial clavicle to stretch "fingers" along anterior neck.              PT Education - 05/03/19 1501    Education Details  Continued to instruct and review in head/neck MLD; also how to apply kinesiotape issued today    Person(s) Educated  Patient    Methods  Explanation;Demonstration    Comprehension  Verbalized understanding;Need further instruction          PT Long Term Goals - 04/26/19 1459      PT LONG TERM GOAL #1   Title  Pt / wife will be independent in MLD and use of compression to manage lymphedema     Baseline  pt and spouse are progressing towards indep but are not there quite yet, they still require verbal cueing 04/26/19    Time  4    Period  Weeks    Status  On-going    Target Date  05/24/19      PT LONG  TERM GOAL #2   Title  Pt will have a decrease of .5 cm around neck circumference  when measured at widest point     Time  4    Period  Weeks    Status  Achieved      PT LONG TERM GOAL #3   Title  Pt will obtain appropriate head and neck garment that is appropriate to measure his lymphedema long term.    Time  4    Period  Weeks    Status  New    Target Date  05/24/19            Plan - 05/03/19 1502    Clinical Impression Statement  Reviewed and continued manual lymph drainage for head/neck sequence having pts wife return demo with hand over hand pressure for education of correct pressure. Also issued kinesiotape already cut for application and instructed on how to don this. Pt has not yet heard from [facility] but demographics only just sent yesterday. He is wearing his chip pack as much as able when at home.    Personal Factors and Comorbidities  Past/Current Experience;Comorbidity 2    Comorbidities  surgery and radiation      Stability/Clinical Decision Making  Stable/Uncomplicated    Rehab Potential  Good    PT Frequency  2x / week    PT Duration  4 weeks    PT Treatment/Interventions  ADLs/Self Care Home Management;Patient/family education;Orthotic Fit/Training;Manual lymph drainage;Compression bandaging;Taping;Scar mobilization    PT Next Visit Plan  see how they did with kinesiotape application;  continue to teach/perform head and neck MLD, check to see if pt has heard about insurance coverage from [employer] and Agree with Plan of Care  Patient    Family Member Consulted  wife       Patient will benefit from skilled therapeutic intervention in order to improve the following deficits and impairments:  Increased edema, Decreased knowledge of precautions, Decreased knowledge of use of DME, Increased fascial restricitons, Postural dysfunction  Visit Diagnosis: 1. Lymphedema, not elsewhere classified   2. Abnormal posture        Problem List Patient Active Problem List   Diagnosis Date Noted  . 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 Parotid mass 12/01/2018    Otelia Limes, PTA 05/03/2019, 3:06 PM  [place], [place], [address] Phone: [phone]   Fax:  [phone]  Name: Christopher Pacheco MRN: [id number] Date of Birth: 10/01/1961

## 2019-05-08 ENCOUNTER — Ambulatory Visit: Payer: BC Managed Care – PPO | Admitting: Physical Therapy

## 2019-05-08 ENCOUNTER — Other Ambulatory Visit: Payer: Self-pay

## 2019-05-08 DIAGNOSIS — I890 Lymphedema, not elsewhere classified: Secondary | ICD-10-CM

## 2019-05-08 DIAGNOSIS — R293 Abnormal posture: Secondary | ICD-10-CM

## 2019-05-08 NOTE — Therapy (Signed)
[place], [place], [address] Phone: [phone]   Fax:  [phone]  Physical Therapy Treatment  Patient Details  Name: Christopher Pacheco MRN: [id number] Date of Birth: July 14, 1961 Referring Provider (PT): Reita May Date: 05/08/2019  PT End of Session - 05/08/19 1831    Visit Number  7    Date for PT Re-Evaluation  05/12/19    PT Start Time  1430    PT Stop Time  1515    PT Time Calculation (min)  45 min    Activity Tolerance  Patient tolerated treatment well    Behavior During Therapy  [facility] for tasks assessed/performed       Past Medical History:  Diagnosis Date  . Allergy   . Diffuse infection of pancreas    30 years ago  . Diverticulitis   . Fatty liver    Dr. Watt Climes  . Medical history non-contributory     Past Surgical History:  Procedure Laterality Date  . COLONOSCOPY    . PAROTIDECTOMY Left 12/01/2018   Procedure: PAROTIDECTOMY;  Surgeon: [name], MD;  Location: [place];  Service: ENT;  Laterality: Left;    There were no vitals filed for this visit.  Subjective Assessment - 05/08/19 1828    Subjective  pt happy to have had his garment measured yesterday.  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  No/denies                       [facility] Adult PT Treatment/Exercise - 05/08/19 0001      Manual Therapy   Edema Management  gave pt information about Head and Neck Flexitouc for him to review and decide if he wants to go forward     Manual Lymphatic Drainage (MLD)  Reviewed MLD per handout that pt brought with hand over hand cues as necessary.  Supplemented that with MLD by PT for axillary and pectoreal nodes, stationary circles at shoulders, lateral and anterior neck with extra time spent on  lateral left neck at scar                   PT Long Term Goals - 04/26/19 1459      PT LONG TERM GOAL #1   Title  Pt / wife will be independent in MLD and use of compression to manage lymphedema     Baseline  pt and spouse are progressing towards indep but are not there quite yet, they still require verbal cueing 04/26/19    Time  4    Period  Weeks    Status  On-going    Target Date  05/24/19      PT LONG TERM GOAL #2   Title  Pt will have a decrease of .5 cm around neck circumference  when measured at widest point     Time  4    Period  Weeks    Status  Achieved      PT LONG TERM GOAL #3   Title  Pt will obtain appropriate head and neck garment that is appropriate to measure his lymphedema long term.    Time  4    Period  Weeks    Status  New  Target Date  05/24/19            Plan - 05/08/19 1832    Clinical Impression Statement  Pt did not do kinesiotape application at home Pt wants to learn to do the MLD himself so today's session was focused on that.  He still has some firmness on the left side of neck near the incision, but scar is moving well.  Infomration given for hiime to consider Flexitouch, but suggested he wait to see how he does with jovi pak before he decides    Comorbidities  surgery and radiation     PT Treatment/Interventions  ADLs/Self Care Home Management;Patient/family education;Orthotic Fit/Training;Manual lymph drainage;Compression bandaging;Taping;Scar mobilization    PT Next Visit Plan  ;  continue to teach/perform head and neck MLD, check to see if pt has heard about insurance coverage from [employer] and Agree with Plan of Care  Patient       Patient will benefit from skilled therapeutic intervention in order to improve the following deficits and impairments:  Increased edema, Decreased knowledge of precautions, Decreased knowledge of use of DME, Increased fascial restricitons, Postural dysfunction  Visit Diagnosis: 1.  Lymphedema, not elsewhere classified   2. Abnormal posture        Problem List Patient Active Problem List   Diagnosis Date Noted  . Cancer of parotid gland ([place]) 01/09/2019  . Parotid mass 12/01/2018   Donato Heinz. Owens Shark PT  Norwood Levo 05/08/2019, 6:52 PM  [place], [place], [address] Phone: [phone]   Fax:  [phone]  Name: Christopher Pacheco MRN: [id number] Date of Birth: July 27, 1961

## 2019-05-10 ENCOUNTER — Encounter: Payer: Self-pay | Admitting: Physical Therapy

## 2019-05-10 ENCOUNTER — Ambulatory Visit: Payer: BC Managed Care – PPO | Admitting: Physical Therapy

## 2019-05-10 ENCOUNTER — Other Ambulatory Visit: Payer: Self-pay

## 2019-05-10 DIAGNOSIS — R293 Abnormal posture: Secondary | ICD-10-CM

## 2019-05-10 DIAGNOSIS — I890 Lymphedema, not elsewhere classified: Secondary | ICD-10-CM

## 2019-05-10 NOTE — Therapy (Signed)
[place], [place], [address] Phone: [phone]   Fax:  [phone]  Physical Therapy Treatment  Patient Details  Name: Christopher Pacheco MRN: [id number] Date of Birth: [birth year] Referring Provider (PT): Reita May Date: 05/10/2019  PT End of Session - 05/10/19 8850    Visit Number  8    Number of Visits  17    Date for PT Re-Evaluation  06/09/19    PT Start Time  2774    PT Stop Time  1615    PT Time Calculation (min)  45 min    Activity Tolerance  Patient tolerated treatment well       Past Medical History:  Diagnosis Date  . Allergy   . Diffuse infection of pancreas    30 years ago  . Diverticulitis   . Fatty liver    Dr. Watt Climes  . Medical history non-contributory     Past Surgical History:  Procedure Laterality Date  . COLONOSCOPY    . PAROTIDECTOMY Left 12/01/2018   Procedure: PAROTIDECTOMY;  Surgeon: [name], MD;  Location: [place];  Service: ENT;  Laterality: Left;    There were no vitals filed for this visit.  Subjective Assessment - 05/10/19 1535    Subjective  Pt says he is doing well.  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  No/denies         [facility] PT Assessment - 05/10/19 0001      Assessment   Medical Diagnosis  left parotid cancer     Referring Provider (PT)  Isidore Moos     Onset Date/Surgical Date  12/01/18      Prior Function   Level of Independence  Independent        LYMPHEDEMA/ONCOLOGY QUESTIONNAIRE - 05/10/19 1544      Head and Neck   4 cm superior to sternal notch around neck  39 cm    6 cm superior to sternal notch around neck  39 cm    8 cm superior to sternal notch around  neck  39.8 cm                [facility] Adult PT Treatment/Exercise - 05/10/19 0001      Manual Therapy   Edema Management  pt would like to try compression with the jovi pak prior to deciding about moving forward with the Flexitouch     Manual Lymphatic Drainage (MLD)  in sitting. short neck shoulder circles, axillary and pectoral nodes, stationary circles at anterior, lateral neck and under chin with extra time at full area more on left side and around left neck incision going toward posterior nodes.      Kinesiotex  Edema      Kinesiotix   Edema  skin kote, then fan shaped ktape going from full area at left anteior neck to posterior upper trap area                   PT Long Term Goals - 05/10/19 1638      PT LONG TERM GOAL #1   Title  Pt / wife will be independent  in MLD and use of compression to manage lymphedema     Baseline  pt and spouse are progressing towards indep but are not there quite yet, they are still asking questions and need cueing    Time  4    Status  On-going      PT LONG TERM GOAL #2   Title  Pt will have a decrease of .5 cm around neck circumference  when measured at widest point     Time  4    Period  Weeks    Status  On-going      PT LONG TERM GOAL #3   Title  Pt will obtain appropriate head and neck garment that is appropriate to measure his lymphedema long term.    Baseline  it has been ordered    Time  4    Period  Weeks    Status  On-going            Plan - 05/10/19 1634    Clinical Impression Statement  Remeasured pt today and he shows modest reduction in measurment despite MLD here and efforts at home for self MLD and wearing the chip pack. Jovi pak has been ordered but not yet received.  kinesiotape tried again today. We want to continue MLD til jovipak arrives as it may need supplementation of foam under his chin.  renewal sent for 4 more weeks and it should arrive within that time period    Comorbidities  surgery and  radiation     PT Frequency  2x / week    PT Duration  4 weeks    PT Treatment/Interventions  ADLs/Self Care Home Management;Patient/family education;Orthotic Fit/Training;Manual lymph drainage;Compression bandaging;Taping;Scar mobilization    PT Next Visit Plan  ;  continue to teach/perform head and neck MLD, update kinesiotape and chip pack as needed , check fit of Jovi pak and supplement as needed       Patient will benefit from skilled therapeutic intervention in order to improve the following deficits and impairments:  Increased edema, Decreased knowledge of precautions, Decreased knowledge of use of DME, Increased fascial restricitons, Postural dysfunction  Visit Diagnosis: 1. Abnormal posture   2. Lymphedema, not elsewhere classified        Problem List Patient Active Problem List   Diagnosis Date Noted  . Cancer of parotid gland ([place]) 01/09/2019  . Parotid mass 12/01/2018   Donato Heinz. Owens Shark PT  Norwood Levo 05/10/2019, 4:42 PM  [place], [place], [address] Phone: [phone]   Fax:  [phone]  Name: CHUKWUMA STRAUS MRN: [id number] Date of Birth: 19-Aug-1961

## 2019-05-14 ENCOUNTER — Encounter: Payer: BC Managed Care – PPO | Admitting: Physical Therapy

## 2019-05-16 ENCOUNTER — Other Ambulatory Visit: Payer: Self-pay

## 2019-05-16 ENCOUNTER — Ambulatory Visit: Payer: BC Managed Care – PPO | Attending: Radiation Oncology | Admitting: Rehabilitation

## 2019-05-16 ENCOUNTER — Encounter: Payer: Self-pay | Admitting: Rehabilitation

## 2019-05-16 DIAGNOSIS — I890 Lymphedema, not elsewhere classified: Secondary | ICD-10-CM | POA: Diagnosis present

## 2019-05-16 DIAGNOSIS — R293 Abnormal posture: Secondary | ICD-10-CM | POA: Insufficient documentation

## 2019-05-16 NOTE — Therapy (Signed)
[place], [place], [address] Phone: [phone]   Fax:  [phone]  Physical Therapy Treatment  Patient Details  Name: Christopher Pacheco MRN: [id number] Date of Birth: Aug 23, 1961 Referring Provider (PT): Reita May Date: 05/16/2019  PT End of Session - 05/16/19 1703    Visit Number  9    Number of Visits  17    Date for PT Re-Evaluation  06/09/19    PT Start Time  1600    PT Stop Time  1645    PT Time Calculation (min)  45 min    Activity Tolerance  Patient tolerated treatment well    Behavior During Therapy  [facility] for tasks assessed/performed       Past Medical History:  Diagnosis Date  . Allergy   . Diffuse infection of pancreas    30 years ago  . Diverticulitis   . Fatty liver    Dr. Watt Climes  . Medical history non-contributory     Past Surgical History:  Procedure Laterality Date  . COLONOSCOPY    . PAROTIDECTOMY Left 12/01/2018   Procedure: PAROTIDECTOMY;  Surgeon: [name], MD;  Location: [place];  Service: ENT;  Laterality: Left;    There were no vitals filed for this visit.  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  No/denies                       [facility] Adult PT Treatment/Exercise - 05/16/19 0001      Manual Therapy   Manual Lymphatic Drainage (MLD)  in supine HOB elevated. short neck shoulder circles, deep breathing, axillary and pectoral nodes, stationary circles at anterior, lateral neck and under chin with extra time at full area more on left side and around left neck incision going toward posterior nodes.      Kinesiotex  Edema      Kinesiotix   Edema  skin kote, then fan shaped ktape going from full area at left anteior neck to posterior upper trap area                    PT Long Term Goals - 05/10/19 1638      PT LONG TERM GOAL #1   Title  Pt / wife will be independent in MLD and use of compression to manage lymphedema     Baseline  pt and spouse are progressing towards indep but are not there quite yet, they are still asking questions and need cueing    Time  4    Status  On-going      PT LONG TERM GOAL #2   Title  Pt will have a decrease of .5 cm around neck circumference  when measured at widest point     Time  4    Period  Weeks    Status  On-going      PT LONG TERM GOAL #3   Title  Pt will obtain appropriate head and neck garment that is appropriate to measure his lymphedema long term.    Baseline  it has been ordered    Time  4    Period  Weeks    Status  On-going  Plan - 05/16/19 1703    Clinical Impression Statement  Pt continues to do well and is very self motivated.  minimal fibrosis felt today mainly some mild scar tissue at the incision.  Emailed [facility] about garment status    PT Treatment/Interventions  ADLs/Self Care Home Management;Patient/family education;Orthotic Fit/Training;Manual lymph drainage;Compression bandaging;Taping;Scar mobilization    PT Next Visit Plan  ;  continue to teach/perform head and neck MLD, update kinesiotape and chip pack as needed , check fit of Jovi pak and supplement as needed    Consulted and Agree with Plan of Care  Patient;Family member/caregiver    Family Member Consulted  wife       Patient will benefit from skilled therapeutic intervention in order to improve the following deficits and impairments:     Visit Diagnosis: 1. Abnormal posture   2. Lymphedema, not elsewhere classified        Problem List Patient Active Problem List   Diagnosis Date Noted  . Cancer of parotid gland ([place]) 01/09/2019  . Parotid mass 12/01/2018    Shan Levans PT 05/16/2019, 5:05 PM  [place], [place], [address] Phone: [phone]   Fax:  [phone]  Name: DAEL HOWLAND MRN: [id number] Date of Birth: 1961/07/13

## 2019-05-17 ENCOUNTER — Encounter: Payer: Self-pay | Admitting: Physical Therapy

## 2019-05-17 ENCOUNTER — Ambulatory Visit: Payer: BC Managed Care – PPO | Admitting: Physical Therapy

## 2019-05-17 ENCOUNTER — Other Ambulatory Visit: Payer: Self-pay

## 2019-05-17 DIAGNOSIS — R293 Abnormal posture: Secondary | ICD-10-CM | POA: Diagnosis not present

## 2019-05-17 DIAGNOSIS — I890 Lymphedema, not elsewhere classified: Secondary | ICD-10-CM

## 2019-05-17 NOTE — Therapy (Signed)
[place], [place], [address] Phone: [phone]   Fax:  [phone]  Physical Therapy Treatment  Patient Details  Name: Christopher Pacheco MRN: [id number] Date of Birth: 1961/03/04 Referring Provider (PT): Reita May Date: 05/17/2019  PT End of Session - 05/17/19 1600    Visit Number  10    Number of Visits  17    Date for PT Re-Evaluation  06/09/19    PT Start Time  1505    PT Stop Time  1550    PT Time Calculation (min)  45 min    Activity Tolerance  Patient tolerated treatment well    Behavior During Therapy  [facility] for tasks assessed/performed       Past Medical History:  Diagnosis Date  . Allergy   . Diffuse infection of pancreas    30 years ago  . Diverticulitis   . Fatty liver    Dr. Watt Climes  . Medical history non-contributory     Past Surgical History:  Procedure Laterality Date  . COLONOSCOPY    . PAROTIDECTOMY Left 12/01/2018   Procedure: PAROTIDECTOMY;  Surgeon: [name], MD;  Location: [place];  Service: ENT;  Laterality: Left;    There were no vitals filed for this visit.  Subjective Assessment - 05/17/19 1507    Subjective  The swelling is getting a little better but it fluctuates throughout the day.    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  No/denies    Multiple Pain Sites  No                       [facility] Adult PT Treatment/Exercise - 05/17/19 0001      Manual Therapy   Edema Management  educated pt about FlexiTouch head and neck and sent facesheet to [employer] per pt request    Manual Lymphatic Drainage (MLD)  in supine HOB elevated. short neck shoulder circles, deep breathing, axillary and pectoral nodes, stationary circles at anterior, lateral neck and under chin with extra time at full area  more on left side and around left neck incision going toward posterior nodes.        Kinesiotix   Edema  then fan shaped ktape going from full area at left anteior neck to posterior upper trap area                   PT Long Term Goals - 05/10/19 1638      PT LONG TERM GOAL #1   Title  Pt / wife will be independent in MLD and use of compression to manage lymphedema     Baseline  pt and spouse are progressing towards indep but are not there quite yet, they are still asking questions and need cueing    Time  4    Status  On-going      PT LONG TERM GOAL #2   Title  Pt will have a decrease of .5 cm around neck circumference  when measured at widest point     Time  4    Period  Weeks    Status  On-going      PT LONG TERM GOAL #3   Title  Pt will obtain appropriate head and neck garment that is appropriate  to measure his lymphedema long term.    Baseline  it has been ordered    Time  4    Period  Weeks    Status  On-going            Plan - 05/17/19 1701    Clinical Impression Statement  Educated pt more about benefits of head and neck compression pump. Pt was interested and facesheet was sent to [employer] per pt request. He is currently awaiting arrival of head and neck garment. Continued today with MLD and kinesiotaping to help decrease anterior neck lymphedema.    Rehab Potential  Good    PT Frequency  2x / week    PT Duration  4 weeks    PT Treatment/Interventions  ADLs/Self Care Home Management;Patient/family education;Orthotic Fit/Training;Manual lymph drainage;Compression bandaging;Taping;Scar mobilization    PT Next Visit Plan  continue to teach/perform head and neck MLD, update kinesiotape and chip pack as needed , check fit of Jovi pak and supplement as needed    Recommended Other Services  facesheet sent to [facility] for pump and email sent to [employer] and Agree with Plan of Care  Patient       Patient will benefit from skilled therapeutic  intervention in order to improve the following deficits and impairments:     Visit Diagnosis: 1. Lymphedema, not elsewhere classified        Problem List Patient Active Problem List   Diagnosis Date Noted  . Cancer of parotid gland ([place]) 01/09/2019  . Parotid mass 12/01/2018    Allyson Sabal [facility] 05/17/2019, 5:03 PM  [place], [place], [address] Phone: [phone]   Fax:  [phone]  Name: Christopher Pacheco MRN: [id number] Date of Birth: [birth year]  Manus Gunning, PT 05/17/19 5:03 PM

## 2019-05-21 ENCOUNTER — Other Ambulatory Visit: Payer: Self-pay

## 2019-05-21 ENCOUNTER — Encounter: Payer: Self-pay | Admitting: *Deleted

## 2019-05-21 ENCOUNTER — Ambulatory Visit: Payer: BC Managed Care – PPO

## 2019-05-21 DIAGNOSIS — I890 Lymphedema, not elsewhere classified: Secondary | ICD-10-CM

## 2019-05-21 DIAGNOSIS — R293 Abnormal posture: Secondary | ICD-10-CM | POA: Diagnosis not present

## 2019-05-21 NOTE — Progress Notes (Signed)
Oncology Nurse Navigator Documentation  Flowsheet/spreadsheet update.  Rick Nimah Uphoff, RN, BSN Head & Neck Oncology Nurse Navigator [facility] [phone]   

## 2019-05-21 NOTE — Therapy (Signed)
[place], [place], [address] Phone: [phone]   Fax:  [phone]  Physical Therapy Treatment  Patient Details  Name: BRENIN HEIDELBERGER MRN: [id number] Date of Birth: [birth year] Referring Provider (PT): Reita May Date: 05/21/2019  PT End of Session - 05/21/19 1101    Visit Number  11    Number of Visits  17    Date for PT Re-Evaluation  06/09/19    PT Start Time  6606    PT Stop Time  1046    PT Time Calculation (min)  44 min    Activity Tolerance  Patient tolerated treatment well    Behavior During Therapy  [facility] for tasks assessed/performed       Past Medical History:  Diagnosis Date  . Allergy   . Diffuse infection of pancreas    30 years ago  . Diverticulitis   . Fatty liver    Dr. Watt Climes  . Medical history non-contributory     Past Surgical History:  Procedure Laterality Date  . COLONOSCOPY    . PAROTIDECTOMY Left 12/01/2018   Procedure: PAROTIDECTOMY;  Surgeon: [name], MD;  Location: [place];  Service: ENT;  Laterality: Left;    There were no vitals filed for this visit.  Subjective Assessment - 05/21/19 1007    Subjective  The swelling stills fluctuates. I think it has improved a little, last time Helene Kelp measured me it showed that. But I don't see long lasting improvements.    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  No/denies                       [facility] Adult PT Treatment/Exercise - 05/21/19 0001      Manual Therapy   Manual Lymphatic Drainage (MLD)  In Supine: 5 dipahragmatic breaths, short neck, supraclavicular fossae, bil shoulder collectors, bil axillary and pectoral nodes, bil upper quadrant intact sequence, then anterior and lateral neck, submental nodes, pre-and retroauricular and  suboccipital nodes, working towards lateral pathways and then retracing all steps.       Kinesiotix   Edema  [place] applied and 3- fan shaped kinesiotape going from full area at left anteior neck to posterior upper trap area                   PT Long Term Goals - 05/10/19 1638      PT LONG TERM GOAL #1   Title  Pt / wife will be independent in MLD and use of compression to manage lymphedema     Baseline  pt and spouse are progressing towards indep but are not there quite yet, they are still asking questions and need cueing    Time  4    Status  On-going      PT LONG TERM GOAL #2   Title  Pt will have a decrease of .5 cm around neck circumference  when measured at widest point     Time  4    Period  Weeks    Status  On-going      PT LONG TERM GOAL #3   Title  Pt will obtain appropriate head and neck garment that is appropriate to measure his lymphedema long term.    Baseline  it has  been ordered    Time  4    Period  Weeks    Status  On-going            Plan - 05/21/19 1104    Clinical Impression Statement  Continued with manual lymph drainage of head/neck area. Pts anterior neck area seems softened from when this therapist worked on him last. Also continued with kinesiotape as pt reports this may have helped some and it does not irritate his skin.    Personal Factors and Comorbidities  Past/Current Experience;Comorbidity 2    Comorbidities  surgery and radiation     Stability/Clinical Decision Making  Stable/Uncomplicated    Rehab Potential  Good    PT Frequency  2x / week    PT Duration  4 weeks    PT Treatment/Interventions  ADLs/Self Care Home Management;Patient/family education;Orthotic Fit/Training;Manual lymph drainage;Compression bandaging;Taping;Scar mobilization    PT Next Visit Plan  continue to teach/perform head and neck MLD, update kinesiotape and chip pack as needed , check fit of Jovi pak and supplement as needed; has pt heard from [employer]  regarding pump yet?    Consulted and Agree with Plan of Care  Patient       Patient will benefit from skilled therapeutic intervention in order to improve the following deficits and impairments:  Increased edema, Decreased knowledge of precautions, Decreased knowledge of use of DME, Increased fascial restricitons, Postural dysfunction  Visit Diagnosis: 1. Lymphedema, not elsewhere classified   2. Abnormal posture        Problem List Patient Active Problem List   Diagnosis Date Noted  . Cancer of parotid gland ([place]) 01/09/2019  . Parotid mass 12/01/2018    Otelia Limes, PTA 05/21/2019, 11:07 AM  [place], [place], [address] Phone: [phone]   Fax:  [phone]  Name: VANN OKERLUND MRN: [id number] Date of Birth: 29-May-1961

## 2019-05-23 ENCOUNTER — Other Ambulatory Visit: Payer: Self-pay

## 2019-05-23 ENCOUNTER — Ambulatory Visit: Payer: BC Managed Care – PPO

## 2019-05-23 DIAGNOSIS — R293 Abnormal posture: Secondary | ICD-10-CM

## 2019-05-23 DIAGNOSIS — I890 Lymphedema, not elsewhere classified: Secondary | ICD-10-CM

## 2019-05-23 NOTE — Therapy (Signed)
[place], [place], [address] Phone: [phone]   Fax:  [phone]  Physical Therapy Treatment  Patient Details  Name: Christopher Pacheco MRN: [id number] Date of Birth: Dec 15, 1960 Referring Provider (PT): Reita May Date: 05/23/2019  PT End of Session - 05/23/19 1054    Visit Number  12    Number of Visits  17    Date for PT Re-Evaluation  06/09/19    PT Start Time  1000    PT Stop Time  1046    PT Time Calculation (min)  46 min    Activity Tolerance  Patient tolerated treatment well    Behavior During Therapy  [facility] for tasks assessed/performed       Past Medical History:  Diagnosis Date  . Allergy   . Diffuse infection of pancreas    30 years ago  . Diverticulitis   . Fatty liver    Dr. Watt Climes  . Medical history non-contributory     Past Surgical History:  Procedure Laterality Date  . COLONOSCOPY    . PAROTIDECTOMY Left 12/01/2018   Procedure: PAROTIDECTOMY;  Surgeon: [name], MD;  Location: [facility];  Service: ENT;  Laterality: Left;    There were no vitals filed for this visit.  Subjective Assessment - 05/23/19 1002    Subjective  The tape held well but I'd like it to be more at my anterior throat. Overall things are going well. I heard from [employer] this morning and was told [employer] won't cover it but I can plead "economic circumstance" and try to get it that way. I did get approved for the JoviPak though.    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  No/denies                       [facility] Adult PT Treatment/Exercise - 05/23/19 0001      Manual Therapy   Manual Lymphatic Drainage (MLD)  In Supine: 5 dipahragmatic breaths, short neck, supraclavicular fossae, bil shoulder collectors, bil axillary and pectoral  nodes, bil upper quadrant intact sequence, then anterior and lateral neck, submental nodes, pre-and retroauricular and suboccipital nodes, working towards lateral pathways and then retracing all steps.       Kinesiotix   Edema  Removed tape and reapplied kinesiotape slightly different at anterior throat more horizontally with first strip inferior to mandible and last 2 following below with more pull of tape today as well. Pt reported feeling better pull today of kinesiotape than at last session.                   PT Long Term Goals - 05/10/19 1638      PT LONG TERM GOAL #1   Title  Pt / wife will be independent in MLD and use of compression to manage lymphedema     Baseline  pt and spouse are progressing towards indep but are not there quite yet, they are still asking questions and need cueing    Time  4    Status  On-going      PT LONG TERM GOAL #2   Title  Pt will have a decrease of .5 cm around neck circumference  when measured at widest point     Time  4    Period  Weeks    Status  On-going      PT LONG TERM GOAL #3   Title  Pt will obtain appropriate head and neck garment that is appropriate to measure his lymphedema long term.    Baseline  it has been ordered    Time  4    Period  Weeks    Status  On-going            Plan - 05/23/19 1055    Clinical Impression Statement  Pt has heard from [employer] for compression pump and was told [employer] will not cover but he will be issued a form to try to get this covered. He was covered for JoviPak and plans to call Melissa from [facility] today to get a timeline for when this should arrive.  Continued with manual lymph drainage and kinesiotape, applying slightly different today and pt reporting feeling better pull on skin from this today. Spoke with him about instructing wife in this and he is interested in that, and also if he would like to begin coming 1x/wk now that he is becoming more independent athome that would be  reasonable. He plans to let us know at next session.    Personal Factors and Comorbidities  Past/Current Experience;Comorbidity 2    Comorbidities  surgery and radiation     Stability/Clinical Decision Making  Stable/Uncomplicated    Rehab Potential  Good    PT Frequency  2x / week    PT Duration  4 weeks    PT Treatment/Interventions  ADLs/Self Care Home Management;Patient/family education;Orthotic Fit/Training;Manual lymph drainage;Compression bandaging;Taping;Scar mobilization    PT Next Visit Plan  continue head and neck MLD, update kinesiotape (and instruct wife), check fit of Jovi pak once this comes in; decr to 1x/wk?    PT Home Exercise Plan  Daily self MLD and wear chip pack    Consulted and Agree with Plan of Care  Patient       Patient will benefit from skilled therapeutic intervention in order to improve the following deficits and impairments:  Increased edema, Decreased knowledge of precautions, Decreased knowledge of use of DME, Increased fascial restricitons, Postural dysfunction  Visit Diagnosis: 1. Lymphedema, not elsewhere classified   2. Abnormal posture        Problem List Patient Active Problem List   Diagnosis Date Noted  . Cancer of parotid gland ([place]) 01/09/2019  . Parotid mass 12/01/2018    Otelia Limes, PTA 05/23/2019, 11:00 AM  [place], [place], [address] Phone: [phone]   Fax:  [phone]  Name: Christopher Pacheco MRN: [id number] Date of Birth: 12-27-1960

## 2019-05-29 ENCOUNTER — Encounter: Payer: Self-pay | Admitting: Physical Therapy

## 2019-05-29 ENCOUNTER — Ambulatory Visit: Payer: BC Managed Care – PPO | Admitting: Physical Therapy

## 2019-05-29 ENCOUNTER — Other Ambulatory Visit: Payer: Self-pay

## 2019-05-29 DIAGNOSIS — I890 Lymphedema, not elsewhere classified: Secondary | ICD-10-CM

## 2019-05-29 DIAGNOSIS — R293 Abnormal posture: Secondary | ICD-10-CM | POA: Diagnosis not present

## 2019-05-29 NOTE — Therapy (Signed)
[place], [place], [address] Phone: [phone]   Fax:  [phone]  Physical Therapy Treatment  Patient Details  Name: LONNELL CHAPUT MRN: [id number] Date of Birth: [birth year] Referring Provider (PT): Reita May Date: 05/29/2019  PT End of Session - 05/29/19 1550    Visit Number  13    Number of Visits  17    Date for PT Re-Evaluation  06/09/19    PT Start Time  1502    PT Stop Time  1545    PT Time Calculation (min)  43 min    Activity Tolerance  Patient tolerated treatment well    Behavior During Therapy  [facility] for tasks assessed/performed       Past Medical History:  Diagnosis Date  . Allergy   . Diffuse infection of pancreas    30 years ago  . Diverticulitis   . Fatty liver    Dr. Watt Climes  . Medical history non-contributory     Past Surgical History:  Procedure Laterality Date  . COLONOSCOPY    . PAROTIDECTOMY Left 12/01/2018   Procedure: PAROTIDECTOMY;  Surgeon: [name], MD;  Location: [facility];  Service: ENT;  Laterality: Left;    There were no vitals filed for this visit.  Subjective Assessment - 05/29/19 1503    Subjective  I think it is fine to decrease to once a week. I should receive my JoviPak tomorrow. I do not think I will be able to get any support for the [place] so I am not sure I will be getting it.    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  No/denies    Multiple Pain Sites  No                       [facility] Adult PT Treatment/Exercise - 05/29/19 0001      Manual Therapy   Manual Lymphatic Drainage (MLD)  in supine HOB elevated. short neck shoulder circles, deep breathing, axillary and pectoral nodes, stationary circles at anterior, lateral neck and under chin with extra time at full  area more on left side and around left neck incision going toward posterior nodes.        Kinesiotix   Edema  reapplied kinesiotape slightly different at anterior throat more horizontally with first strip inferior to mandible and last 3 following below with more pull of tape today                   PT Long Term Goals - 05/10/19 1638      PT LONG TERM GOAL #1   Title  Pt / wife will be independent in MLD and use of compression to manage lymphedema     Baseline  pt and spouse are progressing towards indep but are not there quite yet, they are still asking questions and need cueing    Time  4    Status  On-going      PT LONG TERM GOAL #2   Title  Pt will have a decrease of .5 cm around neck circumference  when measured at widest point     Time  4    Period  Weeks    Status  On-going      PT LONG TERM  GOAL #3   Title  Pt will obtain appropriate head and neck garment that is appropriate to measure his lymphedema long term.    Baseline  it has been ordered    Time  4    Period  Weeks    Status  On-going            Plan - 05/29/19 1551    Clinical Impression Statement  Continued with MLD to head and neck today. Pt should receive his compression garment tomorrow and will assess fit at his next appointment on Thursday. After this week will decrease to 1x/wk to see how pt manages at home. Continued with MLD and kinesiotape today to anterior neck.    Stability/Clinical Decision Making  Stable/Uncomplicated    Rehab Potential  Good    PT Frequency  2x / week    PT Treatment/Interventions  ADLs/Self Care Home Management;Patient/family education;Orthotic Fit/Training;Manual lymph drainage;Compression bandaging;Taping;Scar mobilization    PT Next Visit Plan  continue head and neck MLD, update kinesiotape (and instruct wife), check fit of Jovi pak once this comes in; decr to 1x/wk?    PT Home Exercise Plan  Daily self MLD and wear chip pack    Consulted and Agree with Plan of  Care  Patient       Patient will benefit from skilled therapeutic intervention in order to improve the following deficits and impairments:  Increased edema, Decreased knowledge of precautions, Decreased knowledge of use of DME, Increased fascial restricitons, Postural dysfunction  Visit Diagnosis: 1. Lymphedema, not elsewhere classified        Problem List Patient Active Problem List   Diagnosis Date Noted  . Cancer of parotid gland ([place]) 01/09/2019  . Parotid mass 12/01/2018    Allyson Sabal [facility] 05/29/2019, 3:53 PM  [place], [place], [address] Phone: [phone]   Fax:  [phone]  Name: MARTEZ WEIAND MRN: [id number] Date of Birth: [birth year]  Manus Gunning, PT 05/29/19 3:53 PM

## 2019-05-31 ENCOUNTER — Ambulatory Visit: Payer: BC Managed Care – PPO

## 2019-05-31 ENCOUNTER — Other Ambulatory Visit: Payer: Self-pay

## 2019-05-31 DIAGNOSIS — R293 Abnormal posture: Secondary | ICD-10-CM

## 2019-05-31 DIAGNOSIS — I890 Lymphedema, not elsewhere classified: Secondary | ICD-10-CM

## 2019-05-31 NOTE — Therapy (Signed)
[place], [place], [address] Phone: [phone]   Fax:  [phone]  Physical Therapy Treatment  Patient Details  Name: MACHAI DESMITH MRN: [id number] Date of Birth: [birth year] Referring Provider (PT): Reita May Date: 05/31/2019  PT End of Session - 05/31/19 1322    Visit Number  14    Number of Visits  17    Date for PT Re-Evaluation  06/09/19    PT Start Time  3154    PT Stop Time  1320    PT Time Calculation (min)  49 min    Activity Tolerance  Patient tolerated treatment well    Behavior During Therapy  [facility] for tasks assessed/performed       Past Medical History:  Diagnosis Date  . Allergy   . Diffuse infection of pancreas    30 years ago  . Diverticulitis   . Fatty liver    Dr. Watt Climes  . Medical history non-contributory     Past Surgical History:  Procedure Laterality Date  . COLONOSCOPY    . PAROTIDECTOMY Left 12/01/2018   Procedure: PAROTIDECTOMY;  Surgeon: [name], MD;  Location: [place] [facility];  Service: ENT;  Laterality: Left;    There were no vitals filed for this visit.  Subjective Assessment - 05/31/19 1238    Subjective  The way Blaire did the tape last time really helped. I left it on so you could see it and try to replicate as much as possible. My Suzan Garibaldi also came in the mail so I brought tht today.    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  No/denies                       [facility] Adult PT Treatment/Exercise - 05/31/19 0001      Manual Therapy   Edema Management  Pt brought new JoviPak so placed pad inside neck garment and fitted this to him instructing as while donning.     Manual Lymphatic Drainage (MLD)  in supine HOB elevated. short neck shoulder circles, deep breathing, axillary and  pectoral nodes, stationary circles at anterior, lateral neck and under chin with extra time at full area more on left side and around left neck incision going toward posterior nodes.        Kinesiotix   Edema  reapplied kinesiotape at anterior throat more horizontally with first strip inferior to mandible and last 3 following below with more pull of tape today                   PT Long Term Goals - 05/10/19 1638      PT LONG TERM GOAL #1   Title  Pt / wife will be independent in MLD and use of compression to manage lymphedema     Baseline  pt and spouse are progressing towards indep but are not there quite yet, they are still asking questions and need cueing    Time  4    Status  On-going      PT LONG TERM GOAL #2   Title  Pt will have a decrease of .5 cm around neck circumference  when measured at widest point     Time  4    Period  Weeks  Status  On-going      PT LONG TERM GOAL #3   Title  Pt will obtain appropriate head and neck garment that is appropriate to measure his lymphedema long term.    Baseline  it has been ordered    Time  4    Period  Weeks    Status  On-going            Plan - 05/31/19 1323    Clinical Impression Statement  Pt brought his new JoviPak with pad insert so put this together for him and fitted it on him instructing how to do so. Pt reports this comfortable and not too tight. Continued wtih manual lymph drainage and kinesiotape, trying to dupilicate this as done before, today as pt is noting good benefits with both.    Personal Factors and Comorbidities  Past/Current Experience;Comorbidity 2    Comorbidities  surgery and radiation     Stability/Clinical Decision Making  Stable/Uncomplicated    Rehab Potential  Good    PT Frequency  2x / week    PT Duration  4 weeks    PT Treatment/Interventions  ADLs/Self Care Home Management;Patient/family education;Orthotic Fit/Training;Manual lymph drainage;Compression bandaging;Taping;Scar  mobilization    PT Next Visit Plan  continue head and neck MLD, update kinesiotape (and instruct wife), assess if Jovi pak is doing okay; decr to 1x/wk?    PT Home Exercise Plan  Daily self MLD and wear chip pack    Consulted and Agree with Plan of Care  Patient       Patient will benefit from skilled therapeutic intervention in order to improve the following deficits and impairments:  Increased edema, Decreased knowledge of precautions, Decreased knowledge of use of DME, Increased fascial restricitons, Postural dysfunction  Visit Diagnosis: 1. Lymphedema, not elsewhere classified   2. Abnormal posture        Problem List Patient Active Problem List   Diagnosis Date Noted  . Cancer of parotid gland ([place]) 01/09/2019  . Parotid mass 12/01/2018    Otelia Limes, PTA 05/31/2019, 1:28 PM  [place], [place], [address] Phone: [phone]   Fax:  [phone]  Name: GEHRIG PATRAS MRN: [id number] Date of Birth: 06-14-1961

## 2019-06-05 ENCOUNTER — Other Ambulatory Visit: Payer: Self-pay

## 2019-06-05 ENCOUNTER — Encounter: Payer: Self-pay | Admitting: Physical Therapy

## 2019-06-05 ENCOUNTER — Ambulatory Visit: Payer: BC Managed Care – PPO | Admitting: Physical Therapy

## 2019-06-05 DIAGNOSIS — I890 Lymphedema, not elsewhere classified: Secondary | ICD-10-CM

## 2019-06-05 DIAGNOSIS — R293 Abnormal posture: Secondary | ICD-10-CM

## 2019-06-05 NOTE — Therapy (Signed)
[place], [place], [address] Phone: [phone]   Fax:  [phone]  Physical Therapy Treatment  Patient Details  Name: Christopher Pacheco MRN: [id number] Date of Birth: 01-31-1961 Referring Provider (PT): Reita May Date: 06/05/2019  PT End of Session - 06/05/19 1534    Visit Number  15    Number of Visits  17    Date for PT Re-Evaluation  06/09/19    PT Start Time  1430    PT Stop Time  1515    PT Time Calculation (min)  45 min    Activity Tolerance  Patient tolerated treatment well    Behavior During Therapy  [facility] for tasks assessed/performed       Past Medical History:  Diagnosis Date  . Allergy   . Diffuse infection of pancreas    30 years ago  . Diverticulitis   . Fatty liver    Dr. Watt Climes  . Medical history non-contributory     Past Surgical History:  Procedure Laterality Date  . COLONOSCOPY    . PAROTIDECTOMY Left 12/01/2018   Procedure: PAROTIDECTOMY;  Surgeon: [name], MD;  Location: [facility];  Service: ENT;  Laterality: Left;    There were no vitals filed for this visit.  Subjective Assessment - 06/05/19 1531    Subjective  Pt reports he is wearing his jovi pak several hours thoughout the day .  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  No/denies            LYMPHEDEMA/ONCOLOGY QUESTIONNAIRE - 06/05/19 1500      Head and Neck   4 cm superior to sternal notch around neck  38.5 cm    6 cm superior to sternal notch around neck  38.8 cm    8 cm superior to sternal notch around neck  40 cm    Other  widest part of neck 39.5                [facility] Adult PT Treatment/Exercise - 06/05/19 0001      Manual Therapy   Manual Therapy  Edema management;Manual Lymphatic Drainage (MLD)    Edema Management  measured neck, added  a piece of thick gray foam to inside Jovi pak under chin and also at left side of face for added compression to these areas      Manual Lymphatic Drainage (MLD)  brief MLD to anterior and left neck with scar moblizaition at neck                   PT Long Term Goals - 06/05/19 1714      PT LONG TERM GOAL #1   Title  Pt / wife will be independent in MLD and use of compression to manage lymphedema     Status  On-going      PT LONG TERM GOAL #2   Title  Pt will have a decrease of .5 cm around neck circumference  when measured at widest point     Status  On-going      PT LONG TERM GOAL #3   Title  Pt will obtain appropriate head and neck garment that is appropriate to measure his lymphedema long term.    Status  Achieved  Plan - 06/05/19 1535    Clinical Impression Statement  Pt is doing well.  He has had reductions at lower neck, but still has visible and palpable soft edema at upper neck under chin. Pt acknowledges this and will use the extra foam in jovi to address this area and also at left face.  He will have one more session this week and then would like to take a break, to return in 4-6 weeks for remeasurement and renewal if needed    Personal Factors and Comorbidities  Past/Current Experience;Comorbidity 2    Comorbidities  surgery and radiation     Stability/Clinical Decision Making  Stable/Uncomplicated    PT Treatment/Interventions  ADLs/Self Care Home Management;Patient/family education;Orthotic Fit/Training;Manual lymph drainage;Compression bandaging;Taping;Scar mobilization    PT Next Visit Plan  continue head and neck MLD, update kinesiotape (and instruct wife), assess if Jovi pak is doing okay confirm plan to return in 4-6 weeks for remeasurement and discharge or renew at that time    PT Home Exercise Plan  Daily self MLD and wear chip pack    Consulted and Agree with Plan of Care  Patient       Patient will benefit from skilled therapeutic  intervention in order to improve the following deficits and impairments:  Increased edema, Decreased knowledge of precautions, Decreased knowledge of use of DME, Increased fascial restricitons, Postural dysfunction  Visit Diagnosis: 1. Lymphedema, not elsewhere classified   2. Abnormal posture        Problem List Patient Active Problem List   Diagnosis Date Noted  . Cancer of parotid gland ([place]) 01/09/2019  . Parotid mass 12/01/2018   Donato Heinz. Owens Shark PT  Norwood Levo 06/05/2019, 5:15 PM  [place], [place], [address] Phone: [phone]   Fax:  [phone]  Name: Christopher Pacheco MRN: [id number] Date of Birth: Jul 04, 1961

## 2019-06-07 ENCOUNTER — Ambulatory Visit: Payer: BC Managed Care – PPO | Admitting: Physical Therapy

## 2019-06-07 ENCOUNTER — Other Ambulatory Visit: Payer: Self-pay

## 2019-06-07 ENCOUNTER — Encounter: Payer: Self-pay | Admitting: Physical Therapy

## 2019-06-07 DIAGNOSIS — R293 Abnormal posture: Secondary | ICD-10-CM | POA: Diagnosis not present

## 2019-06-07 DIAGNOSIS — I890 Lymphedema, not elsewhere classified: Secondary | ICD-10-CM

## 2019-06-07 NOTE — Therapy (Signed)
[place], [place], [address] Phone: [phone]   Fax:  [phone]  Physical Therapy Treatment  Patient Details  Name: Christopher Pacheco MRN: [id number] Date of Birth: 02/03/1961 Referring Provider (PT): Reita May Date: 06/07/2019  PT End of Session - 06/07/19 1552    Visit Number  16    Number of Visits  17    Date for PT Re-Evaluation  06/09/19    PT Start Time  1500    PT Stop Time  1545    PT Time Calculation (min)  45 min    Activity Tolerance  Patient tolerated treatment well    Behavior During Therapy  [facility] for tasks assessed/performed       Past Medical History:  Diagnosis Date  . Allergy   . Diffuse infection of pancreas    30 years ago  . Diverticulitis   . Fatty liver    Dr. Watt Climes  . Medical history non-contributory     Past Surgical History:  Procedure Laterality Date  . COLONOSCOPY    . PAROTIDECTOMY Left 12/01/2018   Procedure: PAROTIDECTOMY;  Surgeon: [name], MD;  Location: [place];  Service: ENT;  Laterality: Left;    There were no vitals filed for this visit.  Subjective Assessment - 06/07/19 1502    Subjective  Some of my measurements have improved. I had more padding added to my garment.    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  No/denies                       [facility] Adult PT Treatment/Exercise - 06/07/19 0001      Manual Therapy   Manual Lymphatic Drainage (MLD)  in supine HOB elevated. short neck shoulder circles, deep breathing, axillary and pectoral nodes, stationary circles at anterior, lateral neck and under chin with extra time at full area more on left side and around left neck incision going toward posterior nodes.    (Pended)                   PT Long Term  Goals - 06/05/19 1714      PT LONG TERM GOAL #1   Title  Pt / wife will be independent in MLD and use of compression to manage lymphedema     Status  On-going      PT LONG TERM GOAL #2   Title  Pt will have a decrease of .5 cm around neck circumference  when measured at widest point     Status  On-going      PT LONG TERM GOAL #3   Title  Pt will obtain appropriate head and neck garment that is appropriate to measure his lymphedema long term.    Status  Achieved            Plan - 06/07/19 1552    Clinical Impression Statement  Continued with MLD to anterior neck today with focus on sub mental area where pt feels the most fullness. Will place pt on hold for a month and then re check to see if he is able to independently manage hi sswelling.    Stability/Clinical Decision Making  Stable/Uncomplicated    Rehab Potential  Good    PT Frequency  2x / week    PT Duration  4 weeks    PT Treatment/Interventions  ADLs/Self Care Home Management;Patient/family education;Orthotic Fit/Training;Manual lymph drainage;Compression bandaging;Taping;Scar mobilization    PT Next Visit Plan  remeasurement and discharge or renew at that time    PT Home Exercise Plan  Daily self MLD and wear chip pack    Consulted and Agree with Plan of Care  Patient       Patient will benefit from skilled therapeutic intervention in order to improve the following deficits and impairments:  Increased edema, Decreased knowledge of precautions, Decreased knowledge of use of DME, Increased fascial restricitons, Postural dysfunction  Visit Diagnosis: 1. Lymphedema, not elsewhere classified        Problem List Patient Active Problem List   Diagnosis Date Noted  . Cancer of parotid gland ([place]) 01/09/2019  . Parotid mass 12/01/2018    Allyson Sabal [facility] 06/07/2019, 3:55 PM  [place], [place], [address] Phone: [phone]   Fax:   [phone]  Name: TOU HAYNER MRN: [id number] Date of Birth: 07-14-1961  Manus Gunning, PT 06/07/19 3:55 PM

## 2019-06-11 ENCOUNTER — Ambulatory Visit
Admission: RE | Admit: 2019-06-11 | Discharge: 2019-06-11 | Disposition: A | Discharge status: Home / Self Care | Payer: BC Managed Care – PPO | Source: Ambulatory Visit | Attending: Radiation Oncology | Admitting: Radiation Oncology

## 2019-06-11 DIAGNOSIS — C07 Malignant neoplasm of parotid gland: Secondary | ICD-10-CM

## 2019-06-11 MED ORDER — IOPAMIDOL (ISOVUE-300) INJECTION 61%
120.0000 mL | Freq: Once | INTRAVENOUS | Status: AC | PRN
  Administered 2019-06-11: 120 mL via INTRAVENOUS

## 2019-06-11 NOTE — Progress Notes (Signed)
Christopher Pacheco presents for follow up of radiation completed 03/05/19 to his left parotid gland  Pain issues, if any: He denies Using a feeding tube?: No Weight changes, if any:  Wt Readings from Last 3 Encounters:  06/12/19 160 lb 8 oz (72.8 kg)  05/01/19 152 lb (68.9 kg)  01/23/19 164 lb 8 oz (74.6 kg)   Swallowing issues, if any: He denies Smoking or chewing tobacco? No Using fluoride trays daily? Yes, he uses daily.  Last ENT visit was on: Dr. [name] not since diagnosis. Other notable issues, if any:  06/11/19 CT neck/ chest Ongoing follow up with PT for lymphedema.  He would like to ask Dr. Isidore Moos about having an appointment with an Eye doctor related to his radiation treatment that he received to the left side of his face.   BP 116/80 (BP Location: Left Arm, Patient Position: Sitting)   Pulse 73   Temp 98.2 F (36.8 C) (Temporal)   Resp 18   Ht 5\' 10"  (1.778 m)   Wt 160 lb 8 oz (72.8 kg)   SpO2 100%   BMI 23.03 kg/m

## 2019-06-12 ENCOUNTER — Encounter: Payer: Self-pay | Admitting: Radiation Oncology

## 2019-06-12 ENCOUNTER — Ambulatory Visit
Admission: RE | Admit: 2019-06-12 | Discharge: 2019-06-12 | Disposition: A | Discharge status: Home / Self Care | Payer: BC Managed Care – PPO | Source: Ambulatory Visit | Attending: Radiation Oncology | Admitting: Radiation Oncology

## 2019-06-12 ENCOUNTER — Other Ambulatory Visit: Payer: Self-pay

## 2019-06-12 DIAGNOSIS — I890 Lymphedema, not elsewhere classified: Secondary | ICD-10-CM | POA: Insufficient documentation

## 2019-06-12 DIAGNOSIS — C07 Malignant neoplasm of parotid gland: Secondary | ICD-10-CM | POA: Insufficient documentation

## 2019-06-12 DIAGNOSIS — Z923 Personal history of irradiation: Secondary | ICD-10-CM | POA: Diagnosis not present

## 2019-06-12 HISTORY — DX: Personal history of irradiation: Z92.3

## 2019-06-12 NOTE — Progress Notes (Signed)
Radiation Oncology         [phone] ________________________________  Name: Christopher Pacheco MRN: [id number]  Date: 06/12/2019  DOB: 16-Oct-1961  Follow-Up Visit Note  CC: Christopher Cruel, MD  Christopher Pacheco, *  Diagnosis and Prior Radiotherapy:       ICD-10-CM   1. 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      Cancer Staging Cancer of parotid gland [facility]) Staging form: Major Salivary Glands, AJCC 8th Edition - Pathologic stage from 01/09/2019: Stage II (pT2, pN0, cM0) - Signed by Christopher Gibson, MD on 01/09/2019  Radiation Treatment Dates: 01/23/2019 through 03/05/2019 Site Technique Total Dose Dose per Fx Completed Fx Beam Energies  Head & neck: HN_Lt_parotid IMRT 60/60 2 30/30 6X   CHIEF COMPLAINT:  Here for follow-up and surveillance of parotid cancer  Narrative:  The patient returns today for routine follow-up of radiation completed 3 months ago to his head and neck and left parotid.  He underwent re-staging scans yesterday, 06/11/2019. CT scan of the chest showed: Stable small nonspecific pulmonary nodules identified in both lungs. No new or enlarging pulmonary nodules. CT scan of the neck showed: Radiation and treatment sequelae to the left parotid gland and left neck. Heterogeneously enhancing residual let parotid gland but no mass-like or measurable areas. Diminutive left neck lymph nodes, and stable to mildly improved asymmetric soft tissue fullness about the left subclavian neurovascular bundle. Overall satisfactory post treatment appearance, but recommend continued neck imaging surveillance.    I looked at his imaging personally.  Pain issues, if any: He denies.  Using a feeding tube?: No  Weight changes, if any:  Wt Readings from Last 3 Encounters:  06/12/19 160 lb 8 oz (72.8 kg)  05/01/19 152 lb (68.9 kg)  01/23/19 164 lb 8 oz (74.6 kg)   Swallowing issues, if any: He denies.  Smoking or chewing tobacco? No  Using fluoride trays daily? Yes, he uses daily.    Last ENT visit was on: Not since diagnosis with Dr. Lucia Pacheco.  Other notable issues, if any: Ongoing follow up with PT for lymphedema. He would like to have an appointment with an eye doctor related to his radiation treatment that he received to the left side of his face.         ALLERGIES:  has No Known Allergies.  Meds: Current Outpatient Medications  Medication Sig Dispense Refill   ibuprofen (ADVIL,MOTRIN) 200 MG tablet Take 200 mg by mouth every 6 (six) hours as needed.     sodium fluoride (PREVIDENT 5000 PLUS) 1.1 % CREA dental cream Instill cream into fluoride tray. Place over teeth for 5 minutes. Remove. Spit out excess. Do not rinse. Repeat nightly. 1 Tube prn   HYDROcodone-acetaminophen (NORCO/VICODIN) 5-325 MG tablet Take 1-2 tablets by mouth every 6 (six) hours as needed for moderate pain. (Patient not taking: Reported on 06/12/2019) 12 tablet 0   No current facility-administered medications for this encounter.     Physical Findings: The patient is in no acute distress. Patient is alert and oriented. Wt Readings from Last 3 Encounters:  06/12/19 160 lb 8 oz (72.8 kg)  05/01/19 152 lb (68.9 kg)  01/23/19 164 lb 8 oz (74.6 kg)    height is 5\' 10"  (1.778 m) and weight is 160 lb 8 oz (72.8 kg). His temporal temperature is 98.2 F (36.8 C). His blood pressure is 116/80 and his pulse is 73. His respiration is 18 and oxygen saturation is 100%. .  General: Alert  and oriented, in no acute distress HEENT: Head is normocephalic. Extraocular movements are intact. Oropharynx is notable for no lesions Neck: parotid regions/Neck are notable for no masses Skin: Skin in treatment fields shows satisfactory healing  Extremities: No cyanosis or edema. Lymphatics: see Neck Exam Psychiatric: Judgment and insight are intact. Affect is appropriate.   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 Radiation treatment in March and April this year. Restaging. EXAM: CT NECK WITH CONTRAST TECHNIQUE: Multidetector CT imaging of the neck was performed using the standard protocol following the bolus administration of intravenous contrast. CONTRAST:  162mL ISOVUE-300 IOPAMIDOL (ISOVUE-300) INJECTION 61% in conjunction with contrast enhanced imaging of the chest reported separately. COMPARISON:  Neck CT 12/21/2018. chest CT today. FINDINGS: Pharynx and larynx: Laryngeal contours are normal. Pharyngeal soft tissue contours are within normal limits; mild generalized pharyngeal mucosal thickening might be radiation related. Negative right parapharyngeal space. Mild retropharyngeal effusion. Mild left parapharyngeal space stranding. Salivary glands: Asymmetric heterogeneous enhancement of the left parotid gland compared to the right (coronal image 68) although no masslike or measurable areas are identified. Surrounding soft tissue stranding and asymmetric thickening of the left platysma throughout the left neck. No left parotid space mass effect. The right parotid gland is within normal limits. New pronounced asymmetric enhancement of the left submandibular gland (series 2, image 51). No sialolithiasis or enlargement of the left submandibular duct identified. The sublingual and right submandibular gland appear within normal limits. Thyroid: Negative. Lymph nodes: Left level 2 lymph nodes are much smaller but hyperenhancing, such as on series 2, image 43 (4 millimeter short axis). That node was previously 7-8 millimeters. There is soft tissue stranding in the left neck nodal stations and along the left carotid space. No increased left neck nodes. Contralateral right side lymph nodes are stable. Asymmetric soft tissue fullness in the left subclavian region is stable to mildly improved  (coronal image 65). No definite pathologic nodes there. Vascular: Soft tissue stranding along the left carotid space but the major vascular structures in the neck and at the skull base remain patent. Limited intracranial: Negative. Visualized orbits: Negative. Mastoids and visualized paranasal sinuses: Tympanic cavities and visible mastoids remain clear. Mild ethmoid mucosal thickening is stable. Other visible paranasal sinuses are clear. Skeleton: Stable dentition and osseous structures. Cervical spine degeneration. No acute or suspicious osseous lesion. Upper chest: Reported separately today. IMPRESSION: 1. Radiation and treatment sequelae to the left parotid gland and left neck. Heterogeneously enhancing residual left parotid gland but no mass-like or measurable areas. Diminutive left neck lymph nodes, and stable to mildly improved asymmetric soft tissue fullness about the left subclavian neurovascular bundle. Overall satisfactory post treatment appearance, but recommend continued Neck imaging surveillance. 2.  CT Chest today reported separately. Electronically Signed   By: Genevie Ann M.D.   On: 06/11/2019 09:03   Ct Chest W Contrast  Result Date: 06/11/2019 CLINICAL DATA:  History of parotid gland cancer. Restaging status post surgery and radiation. EXAM: CT CHEST WITH CONTRAST TECHNIQUE: Multidetector CT imaging of the chest was performed during intravenous contrast administration. CONTRAST:  156mL ISOVUE-300 IOPAMIDOL (ISOVUE-300) INJECTION 61% COMPARISON:  CT chest 12/21/2018 FINDINGS: Cardiovascular: No significant vascular findings. Normal heart size. No pericardial effusion. Mediastinum/Nodes: No enlarged mediastinal, hilar, or axillary lymph nodes. Thyroid  gland, trachea, and esophagus demonstrate no significant findings. Lungs/Pleura: No pleural effusion identified. Mild changes of emphysema. Several small nodules are again noted: -Index lung nodule within the posterior left lower lobe measures 3 mm,  image 115/8. Unchanged. -tiny posterolateral right upper lobe lung nodule is unchanged measuring 2 mm, image 46/8. -unchanged 4 mm anterior right lower lobe lung nodule, image 103/8. 3 mm lateral left lower lobe lung nodule is unchanged, image 105/8. -No new pulmonary nodules. Upper Abdomen: No acute abnormality. Musculoskeletal: No chest wall abnormality. No acute or significant osseous findings. IMPRESSION: 1. Stable small nonspecific pulmonary nodules identified in both lungs. No new or enlarging pulmonary nodules. Electronically Signed   By: Kerby Moors M.D.   On: 06/11/2019 09:15    Impression/Plan:    1) Head and Neck Cancer Status: favor NED, will see him back in 42mo with repeat CT imaging.  2) Nutritional Status: good PEG tube: none  3) Swallowing: good, no issues  5) Dental: Encouraged to continue regular followup with dentistry, and dental hygiene including fluoride rinses.   6) Thyroid function: No results found for: TSH no symptoms of concern, unlikely to have thyroid issues from RT to ipsilateral neck  7) Other: Gayleen Orem, RN, our Head and Neck Oncology Navigator Will arrange ENT f/u in 20mo. Patient also on for ENT tumor board next week.  8) Follow-up in 5 months. The patient was encouraged to call with any issues or questions before then. CT of neck, chest at that time.  I spent 25 minutes face to face with the patient and more than 50% of that time was spent in counseling and/or coordination of care. _____________________________________   Christopher Gibson, MD  This document serves as a record of services personally performed by Christopher Gibson, MD. It was created on her behalf by Rae Lips, a trained medical scribe. The creation of this record is based on the scribe's personal observations and the provider's statements to them. This document has been checked and approved by the attending provider.

## 2019-06-13 ENCOUNTER — Encounter: Payer: Self-pay | Admitting: Radiation Oncology

## 2019-06-17 ENCOUNTER — Encounter: Payer: Self-pay | Admitting: Radiation Oncology

## 2019-06-17 ENCOUNTER — Other Ambulatory Visit: Payer: Self-pay | Admitting: Radiation Oncology

## 2019-06-17 DIAGNOSIS — C07 Malignant neoplasm of parotid gland: Secondary | ICD-10-CM

## 2019-06-19 ENCOUNTER — Telehealth: Payer: Self-pay | Admitting: *Deleted

## 2019-06-19 NOTE — Telephone Encounter (Signed)
Oncology Nurse Navigator Documentation  Spoke with Christopher Pacheco in follow-up to 7/28 post-tmt appt with Dr. Isidore Moos last week and pending follow-up with ENT [facility].  He voiced understanding he will see Dr. [name] in 2-3 months and Dr. Isidore Moos in mid-Dec following re-staging scans.    Called ENT Dr. Pollie Friar office to coordinate appointment.  Spoke with Pam, requested patient be contacted and scheduled for routine post-tmt follow-up in 2-3 months.  She verbalized understanding.  Gayleen Orem, RN, BSN Head & Neck Oncology Nurse [place] [facility] [place] [phone]

## 2019-07-04 ENCOUNTER — Encounter: Payer: Self-pay | Admitting: Physical Therapy

## 2019-07-04 ENCOUNTER — Other Ambulatory Visit: Payer: Self-pay

## 2019-07-04 ENCOUNTER — Ambulatory Visit: Payer: BC Managed Care – PPO | Attending: Radiation Oncology | Admitting: Physical Therapy

## 2019-07-04 DIAGNOSIS — R293 Abnormal posture: Secondary | ICD-10-CM

## 2019-07-04 DIAGNOSIS — I890 Lymphedema, not elsewhere classified: Secondary | ICD-10-CM | POA: Insufficient documentation

## 2019-07-04 NOTE — Therapy (Signed)
[place], [place], [address] Phone: [phone]   Fax:  [phone]  Physical Therapy Treatment  Patient Details  Name: Christopher Pacheco MRN: [id number] Date of Birth: [birth year] Referring Provider (PT): Reita May Date: 07/04/2019  PT End of Session - 07/04/19 1532    Visit Number  17    Number of Visits  20    Date for PT Re-Evaluation  10/15/19    PT Start Time  1230    PT Stop Time  1325    PT Time Calculation (min)  55 min    Activity Tolerance  Patient tolerated treatment well    Behavior During Therapy  [facility] for tasks assessed/performed       Past Medical History:  Diagnosis Date  . Allergy   . Diffuse infection of pancreas    30 years ago  . Diverticulitis   . Fatty liver    Dr. Watt Climes  . History of radiation therapy 01/23/19- 03/05/19   Left Parotid/ head and neck radiation, 30 fractions of 2 Gy each for total of 60 Gy.   . Medical history non-contributory     Past Surgical History:  Procedure Laterality Date  . COLONOSCOPY    . PAROTIDECTOMY Left 12/01/2018   Procedure: PAROTIDECTOMY;  Surgeon: [name], MD;  Location: [facility];  Service: ENT;  Laterality: Left;    There were no vitals filed for this visit.  Subjective Assessment - 07/04/19 1241    Subjective  Pt says that he averages wearing his jovi pak about 4-5 hours a day.  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     Currently in Pain?  No/denies         [facility] PT Assessment - 07/04/19 0001      Assessment   Medical Diagnosis  left parotid cancer     Referring Provider (PT)  Isidore Moos     Onset Date/Surgical Date  12/01/18      Prior Function   Level of Independence  Independent        LYMPHEDEMA/ONCOLOGY QUESTIONNAIRE -  07/04/19 1250      Head and Neck   4 cm superior to sternal notch around neck  40 cm    6 cm superior to sternal notch around neck  39.5 cm    8 cm superior to sternal notch around neck  40 cm    Other  widest part of neck 40                [facility] Adult PT Treatment/Exercise - 07/04/19 0001      Manual Therapy   Manual therapy comments  remeasured neck circumference     Edema Management  added thin film and small dotted foam pieces to use in Jovi pak  to see if they will offer better compression to pt especailly at lateral left neck     Manual Lymphatic Drainage (MLD)  brief MLD to anterior and left neck in sitting    pt has good scar mobility, but some firmness at left neck                  PT Long Term Goals - 07/04/19 1639      PT LONG TERM GOAL #  1   Title  Pt / wife will be independent in MLD and use of compression to manage lymphedema     Status  Achieved      PT LONG TERM GOAL #2   Title  Pt will have a decrease of 1.5 cm around neck circumference  when measured at widest point    Baseline  41cm on eval, 40 cm on 07/04/2019    Status  Revised      PT LONG TERM GOAL #3   Title  Pt will obtain appropriate head and neck garment that is appropriate to measure his lymphedema long term.    Status  Achieved            Plan - 07/04/19 1624    Clinical Impression Statement  Pt has been doing well with self care at home including self MLD, use of Jovi Pak and exercise. Palpably, he has improved softness of tissues, but there is still firmness deep in left lateral neck.  His neck measurements are reduced from initial eval, but are up or the same at his neck. He would like to continue with monthly rechecks for remeasurement and to see if the new foam pads are working    Comorbidities  surgery and radiation     PT Treatment/Interventions  ADLs/Self Care Home Management;Patient/family education;Orthotic Fit/Training;Manual lymph drainage;Compression  bandaging;Taping;Scar mobilization    PT Next Visit Plan  check to see if new foam packs are making a difference , remeasurement, MLD    PT Home Exercise Plan  Daily self MLD and wear chip pack    Consulted and Agree with Plan of Care  Patient       Patient will benefit from skilled therapeutic intervention in order to improve the following deficits and impairments:     Visit Diagnosis: 1. Lymphedema, not elsewhere classified   2. Abnormal posture        Problem List Patient Active Problem List   Diagnosis Date Noted  . Cancer of parotid gland ([place]) 01/09/2019  . Parotid mass 12/01/2018   Donato Heinz. Owens Shark PT  Norwood Levo 07/04/2019, 4:47 PM  [place], [place], [address] Phone: [phone]   Fax:  [phone]  Name: Christopher Pacheco MRN: [id number] Date of Birth: [birth year]

## 2019-08-01 ENCOUNTER — Other Ambulatory Visit: Payer: Self-pay

## 2019-08-01 ENCOUNTER — Ambulatory Visit: Payer: BC Managed Care – PPO | Attending: Radiation Oncology | Admitting: Physical Therapy

## 2019-08-01 ENCOUNTER — Encounter: Payer: Self-pay | Admitting: Physical Therapy

## 2019-08-01 DIAGNOSIS — I890 Lymphedema, not elsewhere classified: Secondary | ICD-10-CM | POA: Insufficient documentation

## 2019-08-01 DIAGNOSIS — R293 Abnormal posture: Secondary | ICD-10-CM | POA: Insufficient documentation

## 2019-08-01 NOTE — Therapy (Signed)
[place], [place], [address] Phone: [phone]   Fax:  [phone]  Physical Therapy Treatment  Patient Details  Name: Christopher Pacheco MRN: [id number] Date of Birth: 26-Jan-1961 Referring Provider (PT): Reita May Date: 08/01/2019  PT End of Session - 08/01/19 1519    Visit Number  18    Number of Visits  20    Date for PT Re-Evaluation  10/15/19    PT Start Time  [id number]    PT Stop Time  1515    PT Time Calculation (min)  44 min    Activity Tolerance  Patient tolerated treatment well    Behavior During Therapy  [facility] for tasks assessed/performed       Past Medical History:  Diagnosis Date  . Allergy   . Diffuse infection of pancreas    30 years ago  . Diverticulitis   . Fatty liver    Dr. Watt Climes  . History of radiation therapy 01/23/19- 03/05/19   Left Parotid/ head and neck radiation, 30 fractions of 2 Gy each for total of 60 Gy.   . Medical history non-contributory     Past Surgical History:  Procedure Laterality Date  . COLONOSCOPY    . PAROTIDECTOMY Left 12/01/2018   Procedure: PAROTIDECTOMY;  Surgeon: [name], MD;  Location: [facility];  Service: ENT;  Laterality: Left;    There were no vitals filed for this visit.  Subjective Assessment - 08/01/19 1434    Subjective  Pt state he has more energy.  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  No/denies                       [facility] Adult PT Treatment/Exercise - 08/01/19 0001      Manual Therapy   Manual Lymphatic Drainage (MLD)  in supine HOB elevated. short neck shoulder circles, deep breathing, axillary  and pectoral nodes, stationary circles at anterior, lateral neck and under chin with extra time at full area more on left side and around left neck incision going toward posterior nodes. spent on extra time on left cheek and it appeared flatter at end of session   then to sidelying for more work on left neck and upper shoulder going to posterior nodes                   PT Long Term Goals - 07/04/19 1639      PT LONG TERM GOAL #1   Title  Pt / wife will be independent in MLD and use of compression to manage lymphedema     Status  Achieved      PT LONG TERM GOAL #2   Title  Pt will have a decrease of 1.5 cm around neck circumference  when measured at widest point    Baseline  41cm on eval, 40 cm on 07/04/2019    Status  Revised      PT LONG TERM GOAL #3   Title  Pt will obtain appropriate head and neck garment that is appropriate to measure his lymphedema  long term.    Status  Achieved            Plan - 08/01/19 1520    Clinical Impression Statement  Pt has good skin movment in most of neck and around scar.  There is still a pocket of fullness at left neck with thickening of tissue around scar on lateral left neck.  Pt encouraged to continue to do his exercise at home and use the Jovi pak and do self mld as he can    Rehab Potential  Good    PT Frequency  2x / week    PT Treatment/Interventions  ADLs/Self Care Home Management;Patient/family education;Orthotic Fit/Training;Manual lymph drainage;Compression bandaging;Taping;Scar mobilization    PT Next Visit Plan  upadate Jovi pak as needed  remeasurement, MLD       Patient will benefit from skilled therapeutic intervention in order to improve the following deficits and impairments:  Increased edema, Decreased knowledge of precautions, Decreased knowledge of use of DME, Increased fascial restricitons, Postural dysfunction  Visit Diagnosis: Lymphedema, not elsewhere classified  Abnormal posture     Problem  List Patient Active Problem List   Diagnosis Date Noted  . Cancer of parotid gland ([place]) 01/09/2019  . Parotid mass 12/01/2018   Donato Heinz. Owens Shark PT  Norwood Levo 08/01/2019, 3:24 PM  [place], [place], [address] Phone: [phone]   Fax:  [phone]  Name: MALVIN SELZ MRN: [id number] Date of Birth: 04/19/1961

## 2019-08-29 ENCOUNTER — Other Ambulatory Visit: Payer: Self-pay

## 2019-08-29 ENCOUNTER — Ambulatory Visit: Payer: BC Managed Care – PPO | Attending: Radiation Oncology | Admitting: Physical Therapy

## 2019-08-29 DIAGNOSIS — R293 Abnormal posture: Secondary | ICD-10-CM | POA: Diagnosis present

## 2019-08-29 DIAGNOSIS — I890 Lymphedema, not elsewhere classified: Secondary | ICD-10-CM | POA: Diagnosis not present

## 2019-08-29 NOTE — Therapy (Signed)
[place], [place], [address] Phone: [phone]   Fax:  [phone]  Physical Therapy Treatment  Patient Details  Name: JABIN KALICH MRN: [id number] Date of Birth: 24-Mar-1961 Referring Provider (PT): Reita May Date: 08/29/2019  PT End of Session - 08/29/19 1232    Visit Number  19    Number of Visits  20    Date for PT Re-Evaluation  10/15/19    PT Start Time  1100    PT Stop Time  1145    PT Time Calculation (min)  45 min    Activity Tolerance  Patient tolerated treatment well    Behavior During Therapy  [facility] for tasks assessed/performed       Past Medical History:  Diagnosis Date  . Allergy   . Diffuse infection of pancreas    30 years ago  . Diverticulitis   . Fatty liver    Dr. Watt Climes  . History of radiation therapy 01/23/19- 03/05/19   Left Parotid/ head and neck radiation, 30 fractions of 2 Gy each for total of 60 Gy.   . Medical history non-contributory     Past Surgical History:  Procedure Laterality Date  . COLONOSCOPY    . PAROTIDECTOMY Left 12/01/2018   Procedure: PAROTIDECTOMY;  Surgeon: [name], MD;  Location: [facility];  Service: ENT;  Laterality: Left;    There were no vitals filed for this visit.  Subjective Assessment - 08/29/19 1230    Subjective  Pt reports he feels like his energy is almost back to normal and he has been very busy teaching.  He thinks he still has some swelling in the front of his neck                       [facility] Adult PT Treatment/Exercise - 08/29/19 0001      Manual Therapy   Edema Management  upgraded peach foam by adding more pieces to the bottom part of the Jovi pak so he gets compression at the full area.     Manual Lymphatic Drainage (MLD)  in supine HOB elevated. short neck shoulder circles, deep breathing, axillary and pectoral nodes, stationary circles at anterior, lateral neck and  under chin with extra time at full area more on left side and around left neck incision going toward posterior nodes. spent on extra time on left cheek and it appeared flatter at end of session   then to sidelying for more work on left neck and upper shoulder going to posterior nodes                   PT Long Term Goals - 08/29/19 1235      PT LONG TERM GOAL #1   Title  Pt / wife will be independent in MLD and use of compression to manage lymphedema     Status  Achieved      PT LONG TERM GOAL #2   Title  Pt will have a decrease of 1.5 cm around neck circumference  when measured at widest point    Baseline  41cm on eval, 40 cm on 07/04/2019    Status  On-going      PT LONG TERM GOAL #3   Title  Pt will obtain appropriate head and neck garment that is appropriate to measure his lymphedema long term.    Status  Achieved  Plan - 08/29/19 1232    Clinical Impression Statement  Pt continues to make small improvements but lymphedema is still present at anterior neck, especailly on left side.  He continues to do self MLD and technique was reviewed today. Also upgraded foam in jovi pack for more localized compression    Comorbidities  surgery and radiation     PT Frequency  [place]    PT Treatment/Interventions  ADLs/Self Care Home Management;Patient/family education;Orthotic Fit/Training;Manual lymph drainage;Compression bandaging;Taping;Scar mobilization    PT Next Visit Plan  remeasure next session. upadate Jovi pak as needed  , MLD    PT Home Exercise Plan  Daily self MLD and wear chip pack    Consulted and Agree with Plan of Care  Patient       Patient will benefit from skilled therapeutic intervention in order to improve the following deficits and impairments:  Increased edema, Decreased knowledge of precautions, Decreased knowledge of use of DME, Increased fascial restricitons, Postural dysfunction  Visit Diagnosis: Lymphedema, not elsewhere  classified  Abnormal posture     Problem List Patient Active Problem List   Diagnosis Date Noted  . Cancer of parotid gland ([place]) 01/09/2019  . Parotid mass 12/01/2018   Donato Heinz. Owens Shark PT  Norwood Levo 08/29/2019, 12:36 PM  [place], [place], [address] Phone: [phone]   Fax:  [phone]  Name: HARLAN MATHES MRN: [id number] Date of Birth: 1961-02-12

## 2019-09-28 ENCOUNTER — Ambulatory Visit (INDEPENDENT_AMBULATORY_CARE_PROVIDER_SITE_OTHER): Payer: BC Managed Care – PPO | Admitting: Otolaryngology

## 2019-09-28 ENCOUNTER — Encounter (INDEPENDENT_AMBULATORY_CARE_PROVIDER_SITE_OTHER): Payer: Self-pay | Admitting: Otolaryngology

## 2019-09-28 ENCOUNTER — Other Ambulatory Visit: Payer: Self-pay

## 2019-09-28 VITALS — Temp 97.9°F

## 2019-09-28 DIAGNOSIS — H6123 Impacted cerumen, bilateral: Secondary | ICD-10-CM | POA: Diagnosis not present

## 2019-09-28 DIAGNOSIS — Z85818 Personal history of malignant neoplasm of other sites of lip, oral cavity, and pharynx: Secondary | ICD-10-CM

## 2019-09-28 NOTE — Progress Notes (Signed)
HPI: Christopher Pacheco is a 58 y.o. 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  He received postoperative radiation therapy which he completed in April.  A follow-up CT scan of the neck in July demonstrated no gross disease.  He is scheduled to get another CT scan in 2 to 3 months.  He has been doing well with no specific complaints.  He has noticed a small nodule in the posterior right neck that they wanted me to check.  His facial nerve function has returned to essentially normal..  Past Medical History:  Diagnosis Date  . Allergy   . Diffuse infection of pancreas    30 years ago  . Diverticulitis   . Fatty liver    Dr. Watt Climes  . History of radiation therapy 01/23/19- 03/05/19   Left Parotid/ head and neck radiation, 30 fractions of 2 Gy each for total of 60 Gy.   . Medical history non-contributory    Past Surgical History:  Procedure Laterality Date  . COLONOSCOPY    . PAROTIDECTOMY Left 12/01/2018   Procedure: PAROTIDECTOMY;  Surgeon: [name], MD;  Location: [facility];  Service: ENT;  Laterality: Left;   Social History   Socioeconomic History  . Marital status: Married    Spouse name: Not on file  . Number of children: Not on file  . Years of education: Not on file  . Highest education level: Not on file  Occupational History  . Not on file  Social Needs  . Financial resource strain: Not on file  . Food insecurity    Worry: Not on file    Inability: Not on file  . Transportation needs    Medical: No    Non-medical: No  Tobacco Use  . Smoking status: Never Smoker  . Smokeless tobacco: Never Used  Substance and Sexual Activity  . Alcohol use: Yes    Alcohol/week: 2.0 standard drinks    Types: 2 Glasses of wine per week    Comment: weekends  . Drug use: Never  . Sexual activity: Not on file  Lifestyle  . Physical activity    Days per week: Not on file    Minutes per session: Not on file  . Stress: Not on file  Relationships  . Social Herbalist on phone: Not on file    Gets together: Not on file    Attends religious service: Not on file    Active member of club or organization: Not on file    Attends meetings of clubs or organizations: Not on file    Relationship status: Not on file  Other Topics Concern  . Not on file  Social History Narrative   Left-handed. 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 He swims three days a week and plays the violin.   No family history on file. No Known Allergies Prior to Admission medications   Medication Sig Start Date End Date Taking? Authorizing Provider  HYDROcodone-acetaminophen (NORCO/VICODIN) 5-325 MG tablet Take 1-2 tablets by mouth every 6 (six) hours as needed for moderate pain. 12/02/18  Yes [name], MD  ibuprofen (ADVIL,MOTRIN) 200 MG tablet Take 200 mg by mouth every 6 (six) hours as needed.   Yes [provider]  sodium fluoride (PREVIDENT 5000 PLUS) 1.1 % CREA dental cream  Instill cream into fluoride tray. Place over teeth for 5 minutes. Remove. Spit out excess. Do not rinse. Repeat nightly. 01/09/19  Yes Lenn Cal, DDS     Positive ROS: Otherwise negative  All other systems have been reviewed and were otherwise negative with the exception of those mentioned in the HPI and as above.  Physical Exam: General: Alert, no acute distress Ears: He had a large amount of wax in both ears that was cleaned in the office.  TMs were clear bilaterally. Nasal: Clear nasal passages. Oral: Clear oropharynx.  Tonsillar regions and base of tongue were clear on indirect laryngoscopy. Neck: No palpable adenopathy or masses.  No palpable masses within the left parotid region.  No palpable adenopathy lower on the left side of the neck.  The small nodule that he felt on the right  posterior neck represents a small dermal cyst.  No adenopathy noted on the right neck.  Cerumen impaction removal  Date/Time: 09/28/2019 6:35 PM Performed by: [name], MD Authorized by: [name], MD   Consent:    Consent obtained:  Verbal   Consent given by:  Patient   Risks discussed:  Pain and bleeding Procedure details:    Location:  L ear and R ear   Procedure type: curette and forceps   Post-procedure details:    Inspection:  TM intact and canal normal   Hearing quality:  Improved   Patient tolerance of procedure:  Tolerated well, no immediate complications    Assessment: Bilateral cerumen impactions History of left parotid adenocarcinoma 3.2 cm in size  Plan: He will follow-up in 5 months for recheck.   Radene Journey, MD

## 2019-10-03 ENCOUNTER — Encounter: Payer: Self-pay | Admitting: Physical Therapy

## 2019-10-03 ENCOUNTER — Other Ambulatory Visit: Payer: Self-pay

## 2019-10-03 ENCOUNTER — Ambulatory Visit: Payer: BC Managed Care – PPO | Attending: Radiation Oncology | Admitting: Physical Therapy

## 2019-10-03 DIAGNOSIS — I890 Lymphedema, not elsewhere classified: Secondary | ICD-10-CM

## 2019-10-03 DIAGNOSIS — R293 Abnormal posture: Secondary | ICD-10-CM | POA: Insufficient documentation

## 2019-10-03 NOTE — Therapy (Signed)
[place], [place], [address] Phone: [phone]   Fax:  [phone]  Physical Therapy Treatment  Patient Details  Name: Christopher Pacheco MRN: [id number] Date of Birth: 21-Jul-1961 Referring Provider (PT): Isidore Moos   Progress Note Reporting Period 05/17/2019 to 10/03/2019  See note below for Objective Data and Assessment of Progress/Goals.      Encounter Date: 10/03/2019  PT End of Session - 10/03/19 1158    Visit Number  20    Number of Visits  20    Date for PT Re-Evaluation  10/15/19    PT Start Time  1100    PT Stop Time  1145    PT Time Calculation (min)  45 min    Activity Tolerance  Patient tolerated treatment well    Behavior During Therapy  [facility] for tasks assessed/performed       Past Medical History:  Diagnosis Date  . Allergy   . Diffuse infection of pancreas    30 years ago  . Diverticulitis   . Fatty liver    Dr. Watt Climes  . History of radiation therapy 01/23/19- 03/05/19   Left Parotid/ head and neck radiation, 30 fractions of 2 Gy each for total of 60 Gy.   . Medical history non-contributory     Past Surgical History:  Procedure Laterality Date  . COLONOSCOPY    . PAROTIDECTOMY Left 12/01/2018   Procedure: PAROTIDECTOMY;  Surgeon: [name], MD;  Location: [facility];  Service: ENT;  Laterality: Left;    There were no vitals filed for this visit.  Subjective Assessment - 10/03/19 1157    Subjective  Pt is tired from working this semester, but other than that he is doing well. He plans to see Dr. Isidore Moos before the end of the year    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     Patient Stated Goals  to decrease swelling    Currently in Pain?  No/denies            LYMPHEDEMA/ONCOLOGY QUESTIONNAIRE - 10/03/19 1121      Head and Neck   4 cm superior to sternal notch around neck  39 cm    6 cm superior to sternal notch around neck  39.4 cm    8 cm superior to sternal notch around neck  39.2 cm    Other  widest part of neck 39    Other  mild firmness in tissue around scar                 [facility] Adult PT Treatment/Exercise - 10/03/19 0001      Manual Therapy   Manual Lymphatic Drainage (MLD)  in supine HOB elevated. short neck shoulder circles, deep breathing, axillary and pectoral nodes, stationary circles at anterior, lateral neck and under chin with extra time at full area more on left side and around left neck incision going toward posterior nodes. spent on extra time on left cheek and it appeared flatter at end of session   then to sidelying for more work on left neck and upper shoulder going to posterior nodes                   PT Long Term Goals - 10/03/19 1201      PT LONG TERM GOAL #1   Title  Pt / wife will be independent in MLD and use of  compression to manage lymphedema     Status  Achieved      PT LONG TERM GOAL #2   Title  Pt will have a decrease of 1.5 cm around neck circumference  when measured at widest point    Baseline  41cm on eval, 40 cm on 07/04/2019, 39 cm on 10/03/2019    Status  Achieved      PT LONG TERM GOAL #3   Title  Pt will obtain appropriate head and neck garment that is appropriate to measure his lymphedema long term.    Status  Achieved            Plan - 10/03/19 1200    Clinical Impression Statement  Pt has made significant progress in contol of his lymphedema and measurements are decreased.  He is doing exercise, MLD and wearing his Jovipack compression garment. Goals are met and he is ready to discharge from PT    Comorbidities  surgery and radiation     Rehab Potential  Good    PT Treatment/Interventions  ADLs/Self Care Home Management;Patient/family education;Orthotic Fit/Training;Manual lymph drainage;Compression bandaging;Taping;Scar mobilization    PT Next Visit Plan  discharge this episode    PT Home  Exercise Plan  Daily self MLD and wear chip pack       Patient will benefit from skilled therapeutic intervention in order to improve the following deficits and impairments:  Increased edema, Decreased knowledge of precautions, Decreased knowledge of use of DME, Increased fascial restricitons, Postural dysfunction  Visit Diagnosis: Lymphedema, not elsewhere classified  Abnormal posture     Problem List Patient Active Problem List   Diagnosis Date Noted  . Cancer of parotid gland ([place]) 01/09/2019  . Parotid mass 12/01/2018    PHYSICAL THERAPY DISCHARGE SUMMARY  Visits from Start of Care: 20  Current functional level related to goals / functional outcomes: indpendent    Remaining deficits: Mild firmness in tissue around scar on left side of neck   Education / Equipment: Self MLD, exercise and use of compression   Plan: Patient agrees to discharge.  Patient goals were met. Patient is being discharged due to being pleased with the current functional level.  ?????    Donato Heinz. Owens Shark PT  Norwood Levo 10/03/2019, 12:09 PM  [place], [place], [address] Phone: [phone]   Fax:  [phone]  Name: Christopher Pacheco MRN: [id number] Date of Birth: [birth year]

## 2019-10-29 ENCOUNTER — Telehealth: Payer: Self-pay | Admitting: *Deleted

## 2019-10-29 NOTE — Telephone Encounter (Signed)
CALLED PATIENT TO INFORM OF STAT LABS ON 11-01-19 @ 1:45 PM @ [place] AND HIS CT ON 11-01-19- ARRIVAL TIME- 2:45 PM @ [facility], PT. TO HAVE WATER ONLY - 4 HRS. PRIOR TO TEST, PATIENT TO RECEIVE RESULTS FROM DR. SQUIRE ON 11-02-19 @ 10:40 AM, SPOKE WITH PATIENT AND HE IS AWARE OF THESE APPTS.

## 2019-10-31 NOTE — Progress Notes (Signed)
Mr. Hasbrouck presents for follow up of radiation completed 03/05/19 to his left parotid area/ head and neck area.   Pain issues, if any: No Using a feeding tube?: N/A Weight changes, if any:  Wt Readings from Last 3 Encounters:  11/02/19 158 lb (71.7 kg)  06/12/19 160 lb 8 oz (72.8 kg)  05/01/19 152 lb (68.9 kg)   Swallowing issues, if any: He denies. He does report a dry mouth, and will not eat some foods that are dry.  Smoking or chewing tobacco? No Using fluoride trays daily? Yes, nightly Last ENT visit was on: Dr. [name] 09/28/19 (in [place]) Other notable issues, if any:  He is here for results of CT neck/chest completed on 11/01/19.  BP 113/64 (BP Location: Left Arm)   Pulse 66   Temp 97.8 F (36.6 C) (Tympanic)   Wt 158 lb (71.7 kg)   SpO2 100%   BMI 22.67 kg/m

## 2019-11-01 ENCOUNTER — Ambulatory Visit (HOSPITAL_COMMUNITY)
Admission: RE | Admit: 2019-11-01 | Discharge: 2019-11-01 | Disposition: A | Discharge status: Home / Self Care | Payer: BC Managed Care – PPO | Source: Ambulatory Visit | Attending: Radiation Oncology | Admitting: Radiation Oncology

## 2019-11-01 ENCOUNTER — Other Ambulatory Visit: Payer: Self-pay

## 2019-11-01 ENCOUNTER — Ambulatory Visit
Admission: RE | Admit: 2019-11-01 | Discharge: 2019-11-01 | Disposition: A | Discharge status: Home / Self Care | Payer: BC Managed Care – PPO | Source: Ambulatory Visit | Attending: Radiation Oncology | Admitting: Radiation Oncology

## 2019-11-01 DIAGNOSIS — Z8589 Personal history of malignant neoplasm of other organs and systems: Secondary | ICD-10-CM | POA: Insufficient documentation

## 2019-11-01 DIAGNOSIS — C07 Malignant neoplasm of parotid gland: Secondary | ICD-10-CM

## 2019-11-01 LAB — BUN & CREATININE (CHCC)
BUN: 14 mg/dL (ref 6–20)
Creatinine: 0.85 mg/dL (ref 0.61–1.24)
GFR, Est AFR Am: >60 mL/min (ref >60)
GFR, Estimated: >60 mL/min (ref >60)

## 2019-11-01 MED ORDER — IOHEXOL 300 MG/ML  SOLN
75.0000 mL | Freq: Once | INTRAMUSCULAR | Status: AC | PRN
  Administered 2019-11-01: 15:00:00 75 mL via INTRAVENOUS

## 2019-11-01 MED ORDER — SODIUM CHLORIDE (PF) 0.9 % IJ SOLN
INTRAMUSCULAR | Status: AC
  Filled 2019-11-01: qty 50

## 2019-11-01 NOTE — Progress Notes (Signed)
Radiation Oncology         [phone] ________________________________  Name: Christopher Pacheco MRN: [id number]  Date: 11/02/2019  DOB: 1961/09/22  Follow-Up Visit Note in person  CC: Christopher Cruel, MD  Christopher Pacheco, *  Diagnosis and Prior Radiotherapy:       ICD-10-CM   1. Cancer of parotid gland [facility])  C07      Cancer Staging Cancer of parotid gland [facility]) Staging form: Major Salivary Glands, AJCC 8th Edition - Pathologic stage from 01/09/2019: Stage II (pT2, pN0, cM0) - Signed by [name], MD on 01/09/2019  Radiation Treatment Dates: 01/23/2019 through 03/05/2019 Site Technique Total Dose Dose per Fx Completed Fx Beam Energies  Head & neck: HN_Lt_parotid IMRT 60/60 2 30/30 6X   CHIEF COMPLAINT:  Here for follow-up and surveillance of parotid cancer  Narrative:  The patient returns today for routine follow-up of radiation completed on 03/05/2019 to his head and neck and left parotid.    Since his last visit, he underwent restaging neck and chest CT yesterday, 11/01/2019. Neck CT showed: no sign of recurrent or worsening disease; no evidence of definable mass or increasing mass effect; no adenopathy.  Left parotid shows some stable postoperative enhancement; chest CT showed: Stable pulmonary nodules, no sign of aggressive behavior.  I have looked at his images personally  Pain issues, if any: He denies.  Using a feeding tube?: No  Weight changes, if any:  Wt Readings from Last 3 Encounters:  11/02/19 158 lb (71.7 kg)  06/12/19 160 lb 8 oz (72.8 kg)  05/01/19 152 lb (68.9 kg)   Swallowing issues, if any: He denies.  He does have a dry mouth which is manageable.  Smoking or chewing tobacco? No  Using fluoride trays daily? Yes, he uses daily.   Last ENT visit was on: 09/28/2019 with Dr. [name].  Other notable issues, if any:  He noticed a tiny subcutaneous nodule in the right neck  ALLERGIES:  has No Known Allergies.  Meds: Current Outpatient  Medications  Medication Sig Dispense Refill  . ibuprofen (ADVIL,MOTRIN) 200 MG tablet Take 200 mg by mouth every 6 (six) hours as needed.    . sodium fluoride (PREVIDENT 5000 PLUS) 1.1 % CREA dental cream Instill cream into fluoride tray. Place over teeth for 5 minutes. Remove. Spit out excess. Do not rinse. Repeat nightly. 1 Tube prn  . HYDROcodone-acetaminophen (NORCO/VICODIN) 5-325 MG tablet Take 1-2 tablets by mouth every 6 (six) hours as needed for moderate pain. (Patient not taking: Reported on 11/02/2019) 12 tablet 0   No current facility-administered medications for this encounter.    Physical Findings: The patient is in no acute distress. Patient is alert and oriented. Wt Readings from Last 3 Encounters:  11/02/19 158 lb (71.7 kg)  06/12/19 160 lb 8 oz (72.8 kg)  05/01/19 152 lb (68.9 kg)    weight is 158 lb (71.7 kg). His tympanic temperature is 97.8 F (36.6 C). His blood pressure is 113/64 and his pulse is 66. His oxygen saturation is 100%. .    General: Alert and oriented, in no acute distress HEENT: Head is normocephalic. Extraocular movements are intact.   Neck: parotid regions/Neck are notable for no masses indicative of adenopathy.  In the right neck there is a subcentimeter subcutaneous nodule that looks and feels completely benign, possibly an ingrown hair or an inflammatory skin nodule versus a cyst. Skin: Skin in treatment fields shows satisfactory healing with no concerning findings Extremities: No  cyanosis or edema. Lymphatics: see Neck Exam Psychiatric: Judgment and insight are intact. Affect is appropriate.   Lab Findings: No results found for: WBC, HGB, HCT, MCV, PLT  No results found for: TSH  Radiographic Findings: CT Soft Tissue Neck W Contrast  Result Date: 11/01/2019 CLINICAL DATA:  Follow-up parotid carcinoma status post resection and radiation. EXAM: CT NECK WITH CONTRAST TECHNIQUE: Multidetector CT imaging of the neck was performed using the  standard protocol following the bolus administration of intravenous contrast. CONTRAST:  55mL OMNIPAQUE IOHEXOL 300 MG/ML  SOLN COMPARISON:  06/11/2019.  12/21/2018. FINDINGS: Pharynx and larynx: No mucosal or submucosal lesion. Salivary glands: Right parotid and submandibular glands are normal. Left submandibular gland is normal. Previous resection of a left parotid mass. Mild persistent architectural distortion and low level enhancement. No evidence of definable mass lesion. No increasing mass effect. Thyroid: Normal Lymph nodes: No enlarged or low-density nodes on either side of the neck. Vascular: No abnormal vascular finding. Limited intracranial: Normal Visualized orbits: Normal Mastoids and visualized paranasal sinuses: Clear Skeleton: Ordinary cervical spondylosis. Upper chest: Benign appearing pleural and parenchymal scarring at both lung apices. Other: Diminished edema in the left neck related to previous radiation. IMPRESSION: No sign of recurrent or worsening disease. Architectural distortion of the left parotid gland status post mass resection. Low level enhancement in the operative region as seen previously. No evidence of definable mass or increasing mass effect. No adenopathy. Electronically Signed   By: [name] M.D.   On: 11/01/2019 15:35   CT Chest W Contrast  Result Date: 11/01/2019 CLINICAL DATA:  Parotid carcinoma. EXAM: CT CHEST WITH CONTRAST TECHNIQUE: Multidetector CT imaging of the chest was performed during intravenous contrast administration. CONTRAST:  59mL OMNIPAQUE IOHEXOL 300 MG/ML  SOLN COMPARISON:  Neck CT and chest CT 06/11/2019 FINDINGS: Cardiovascular: No significant vascular findings. Normal heart size. No pericardial effusion. Mediastinum/Nodes: No axillary supraclavicular adenopathy. No mediastinal hilar adenopathy. No pericardial effusion. Esophagus normal. Lungs/Pleura: No suspicious pulmonary nodules. The several small pulmonary nodules unchanged. For example 3 mm  nodule in the LEFT lower lobe (1 9/5). Small 3 mm RIGHT lower lobe (image 91/5). No new pulmonary nodules. Upper Abdomen: Limited view of the liver, kidneys, pancreas are unremarkable. Normal adrenal glands. Musculoskeletal: No aggressive osseous lesion. IMPRESSION: Stable very small pulmonary nodules.  No change from comparison exam Electronically Signed   By: Suzy Bouchard M.D.   On: 11/01/2019 15:58    Impression/Plan:    1) Head and Neck Cancer Status: In remission, no evidence of disease  2) Nutritional Status: good PEG tube: none  3) Swallowing: good, no issues  5) Dental: Encouraged to continue regular followup with dentistry, and dental hygiene including fluoride rinses.   6) Thyroid function: No results found for: TSH no symptoms of concern, unlikely to have thyroid issues from RT to ipsilateral neck  7) Other: Gayleen Orem, RN, our Head and Neck Oncology Navigator will arrange ENT f/u in 3 mo.  8) Follow-up in 7 months. The patient was encouraged to call with any issues or questions before then.   I spent 25 minutes face to face with the patient and more than 50% of that time was spent in counseling and/or coordination of care. _____________________________________   [name], MD  This document serves as a record of services personally performed by [name], MD. It was created on her behalf by Wilburn Mylar, a trained medical scribe. The creation of this record is based on the scribe's personal  observations and the provider's statements to them. This document has been checked and approved by the attending provider.

## 2019-11-02 ENCOUNTER — Encounter: Payer: Self-pay | Admitting: Radiation Oncology

## 2019-11-02 ENCOUNTER — Ambulatory Visit
Admission: RE | Admit: 2019-11-02 | Discharge: 2019-11-02 | Disposition: A | Discharge status: Home / Self Care | Payer: BC Managed Care – PPO | Source: Ambulatory Visit | Attending: Radiation Oncology | Admitting: Radiation Oncology

## 2019-11-02 ENCOUNTER — Other Ambulatory Visit: Payer: Self-pay

## 2019-11-02 DIAGNOSIS — Z923 Personal history of irradiation: Secondary | ICD-10-CM | POA: Diagnosis not present

## 2019-11-02 DIAGNOSIS — Z8589 Personal history of malignant neoplasm of other organs and systems: Secondary | ICD-10-CM | POA: Insufficient documentation

## 2019-11-02 DIAGNOSIS — R682 Dry mouth, unspecified: Secondary | ICD-10-CM | POA: Insufficient documentation

## 2019-11-02 DIAGNOSIS — R918 Other nonspecific abnormal finding of lung field: Secondary | ICD-10-CM | POA: Diagnosis not present

## 2019-11-02 DIAGNOSIS — C07 Malignant neoplasm of parotid gland: Secondary | ICD-10-CM

## 2019-11-07 ENCOUNTER — Telehealth: Payer: Self-pay | Admitting: *Deleted

## 2019-11-07 NOTE — Telephone Encounter (Signed)
Oncology Nurse Navigator Documentation  Per patient's 12/22 post-treatment follow-up with Dr. Isidore Moos, called ENT Dr. Pollie Friar office to coordinate appointment.  Spoke with Gay Filler, requested patient be contacted and scheduled for routine follow-up in 3 months.  She verbalized understanding.  Gayleen Orem, RN, BSN Head & Neck Oncology Nurse [facility] [place] [phone]

## 2020-01-14 ENCOUNTER — Ambulatory Visit: Payer: BC Managed Care – PPO | Attending: Internal Medicine

## 2020-01-14 DIAGNOSIS — Z23 Encounter for immunization: Secondary | ICD-10-CM | POA: Insufficient documentation

## 2020-01-14 NOTE — Progress Notes (Signed)
   Covid-19 Vaccination Clinic  Name:  Christopher Pacheco    MRN: [id number] DOB: Apr 20, 1961  01/14/2020  Christopher Pacheco was observed post Covid-19 immunization for 15 minutes without incidence. He was provided with Vaccine Information Sheet and instruction to access the V-Safe system.   Christopher Pacheco was instructed to call 911 with any severe reactions post vaccine: Marland Kitchen Difficulty breathing  . Swelling of your face and throat  . A fast heartbeat  . A bad rash all over your body  . Dizziness and weakness    Immunizations Administered    Name Date Dose VIS Date Route   Pfizer COVID-19 Vaccine 01/14/2020  8:29 AM 0.3 mL 10/26/2019 Intramuscular   Manufacturer: [place]   Lot: [id number]   [place]: [id number]

## 2020-01-31 ENCOUNTER — Ambulatory Visit (INDEPENDENT_AMBULATORY_CARE_PROVIDER_SITE_OTHER): Payer: BC Managed Care – PPO | Admitting: Otolaryngology

## 2020-02-05 ENCOUNTER — Ambulatory Visit (INDEPENDENT_AMBULATORY_CARE_PROVIDER_SITE_OTHER): Payer: BC Managed Care – PPO | Admitting: Otolaryngology

## 2020-02-05 ENCOUNTER — Ambulatory Visit: Payer: BC Managed Care – PPO | Attending: Internal Medicine

## 2020-02-05 ENCOUNTER — Encounter (INDEPENDENT_AMBULATORY_CARE_PROVIDER_SITE_OTHER): Payer: Self-pay | Admitting: Otolaryngology

## 2020-02-05 ENCOUNTER — Other Ambulatory Visit: Payer: Self-pay

## 2020-02-05 VITALS — Temp 97.9°F

## 2020-02-05 DIAGNOSIS — Z85818 Personal history of malignant neoplasm of other sites of lip, oral cavity, and pharynx: Secondary | ICD-10-CM | POA: Diagnosis not present

## 2020-02-05 DIAGNOSIS — J310 Chronic rhinitis: Secondary | ICD-10-CM

## 2020-02-05 DIAGNOSIS — Z23 Encounter for immunization: Secondary | ICD-10-CM

## 2020-02-05 NOTE — Progress Notes (Signed)
HPI: Christopher Pacheco is a 59 y.o. male who returns today for evaluation of adenocarcinoma of the left parotid gland status post initial surgery 12/01/2018.  He received postoperative radiation therapy.  He has done well with no real complaints.  He had a CT scan of his chest and neck performed in December of last year that was clear with no evidence of disease.  Presents today for routine recheck.  Past Medical History:  Diagnosis Date  . Allergy   . Diffuse infection of pancreas    30 years ago  . Diverticulitis   . Fatty liver    Dr. Watt Climes  . History of radiation therapy 01/23/19- 03/05/19   Left Parotid/ head and neck radiation, 30 fractions of 2 Gy each for total of 60 Gy.   . Medical history non-contributory    Past Surgical History:  Procedure Laterality Date  . COLONOSCOPY    . PAROTIDECTOMY Left 12/01/2018   Procedure: PAROTIDECTOMY;  Surgeon: [name], MD;  Location: [facility];  Service: ENT;  Laterality: Left;   Social History   Socioeconomic History  . Marital status: Married    Spouse name: Not on file  . Number of children: Not on file  . Years of education: Not on file  . Highest education level: Not on file  Occupational History  . Not on file  Tobacco Use  . Smoking status: Never Smoker  . Smokeless tobacco: Never Used  Substance and Sexual Activity  . Alcohol use: Yes    Alcohol/week: 2.0 standard drinks    Types: 2 Glasses of wine per week    Comment: weekends  . Drug use: Never  . Sexual activity: Not on file  Other Topics Concern  . Not on file  Social History Narrative   Left-handed. 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 He swims three days a week and plays the violin.   Social Determinants of Health   Financial Resource Strain:   . Difficulty of Paying Living Expenses:   Food Insecurity:   . Worried About Charity fundraiser in  the Last Year:   . Arboriculturist in the Last Year:   Transportation Needs:   . Film/video editor (Medical):   Marland Kitchen Lack of Transportation (Non-Medical):   Physical Activity:   . Days of Exercise per Week:   . Minutes of Exercise per Session:   Stress:   . Feeling of Stress :   Social Connections:   . Frequency of Communication with Friends and Family:   . Frequency of Social Gatherings with Friends and Family:   . Attends Religious Services:   . Active Member of Clubs or Organizations:   . Attends Archivist Meetings:   Marland Kitchen Marital Status:    No family history on file. No Known Allergies Prior to Admission medications   Medication Sig Start Date End Date Taking? Authorizing Provider  HYDROcodone-acetaminophen (NORCO/VICODIN) 5-325 MG tablet Take 1-2 tablets by mouth every 6 (six) hours as needed for moderate pain. 12/02/18  Yes [name], MD  ibuprofen (ADVIL,MOTRIN) 200 MG tablet Take 200 mg by mouth every 6 (six) hours as needed.   Yes [provider]  sodium fluoride (PREVIDENT 5000 PLUS) 1.1 % CREA dental cream Instill cream into fluoride tray. Place over teeth for 5 minutes. Remove. Spit out excess. Do not rinse. Repeat nightly.  01/09/19  Yes Lenn Cal, DDS     Positive ROS: Otherwise negative  All other systems have been reviewed and were otherwise negative with the exception of those mentioned in the HPI and as above.  Physical Exam: Constitutional: Alert, well-appearing, no acute distress Ears: External ears without lesions or tenderness. Ear canals are clear bilaterally with intact, clear TMs.  Nasal: External nose without lesions. Septum with moderate deviation and moderate rhinitis with mucosal swelling and clear mucus discharge.. Oral: Lips and gums without lesions. Tongue and palate mucosa without lesions. Posterior oropharynx clear. Neck: No palpable adenopathy or masses.  He has no palpable masses in the left parotid area.   He has some slight thickening or fullness around the superior aspect of the sternocleidomastoid muscle from previous surgery but no palpable masses.  Of note on the right posterior neck he has a small 5 to 6 mm subcutaneous nodule that is benign on evaluation.  No palpable adenopathy noted. Respiratory: Breathing comfortably  Skin: No facial/neck lesions or rash noted. He has normal facial nerve function on the left side.  Procedures  Assessment: History of left parotid adenocarcinoma status post surgery and radiation therapy Chronic rhinitis  Plan: He will follow-up in 6 months for recheck. Refilled his Nasacort.   Radene Journey, MD

## 2020-02-05 NOTE — Progress Notes (Signed)
   Covid-19 Vaccination Clinic  Name:  RAESHAUN CROSS    MRN: [id number] DOB: 07-18-1961  02/05/2020  Mr. Diab was observed post Covid-19 immunization for 15 minutes without incident. He was provided with Vaccine Information Sheet and instruction to access the V-Safe system.   Mr. Galo was instructed to call 911 with any severe reactions post vaccine: Marland Kitchen Difficulty breathing  . Swelling of face and throat  . A fast heartbeat  . A bad rash all over body  . Dizziness and weakness   Immunizations Administered    Name Date Dose VIS Date Route   Pfizer COVID-19 Vaccine 02/05/2020 10:16 AM 0.3 mL 10/26/2019 Intramuscular   Manufacturer: [place]   Lot: [id number]   [place]: [id number]

## 2020-02-12 ENCOUNTER — Ambulatory Visit: Payer: Self-pay

## 2020-05-28 ENCOUNTER — Telehealth: Payer: Self-pay | Admitting: *Deleted

## 2020-05-28 NOTE — Telephone Encounter (Signed)
CALLED PATIENT TO ASK ABOUT RESCHEDULING FU APPT. WITH DR. Isidore Moos ON 06-06-20 DUE TO DR. SQUIRE BEING ON VACATION, NO ANSWER, UNABLE TO LEAVE MESSAGE DUE TO NO ANSWERING MACHINE, MAILED APPT. CARD

## 2020-06-06 ENCOUNTER — Ambulatory Visit: Payer: Self-pay | Admitting: Radiation Oncology

## 2020-06-06 IMAGING — CT CT NECK WITH CONTRAST
5 of 6 series · 14 of 35 positions shown, 16 images · IV contrast (iopamidol)
Comparison: Neck CT 12/21/2018. chest CT today.

CLINICAL DATA: 57-year-old male status post resection of parotid
adenocarcinoma from the deep lobe. Radiation treatment in [REDACTED] and
[place] this year. Restaging.

EXAM:
CT NECK WITH CONTRAST
TECHNIQUE: Multidetector CT imaging of the neck was performed using the
standard protocol following the bolus administration of intravenous
contrast.
CONTRAST:  120mL [id number] IOPAMIDOL ([id number]) INJECTION 61% in
conjunction with contrast enhanced imaging of the chest reported
separately.

[Series 2: neck 2.00 br40 s3 st/ no angle · axial · 0.46mm/px · z∈[-772,-683]mm · 2 of 136 slices shown, 3 images]
[im 46/136  soft-tissue]
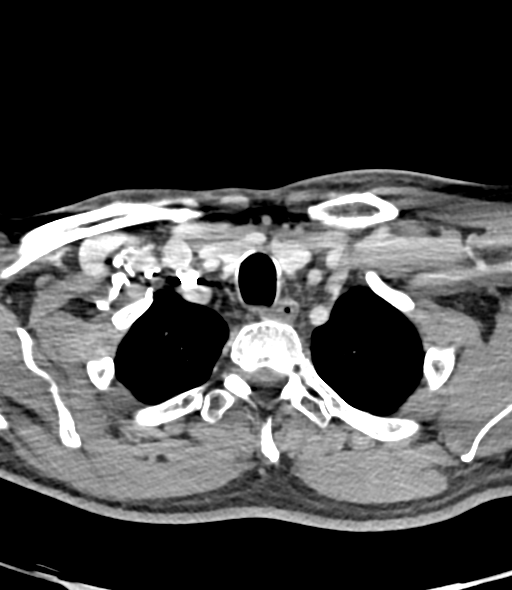
[im 46/136  bone]
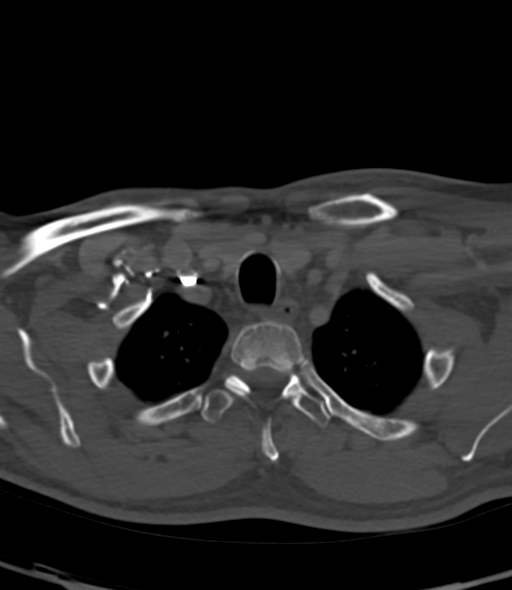
[im 91/136  bone]
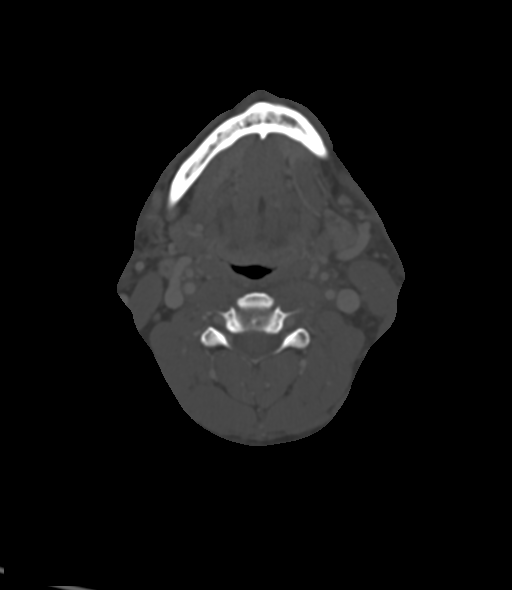

[Series 4: neck 2.00 br60 s3 bone/ no angle · axial · 0.46mm/px · z∈[-772,-683]mm · 2 of 136 slices shown]
[im 46/136  bone]
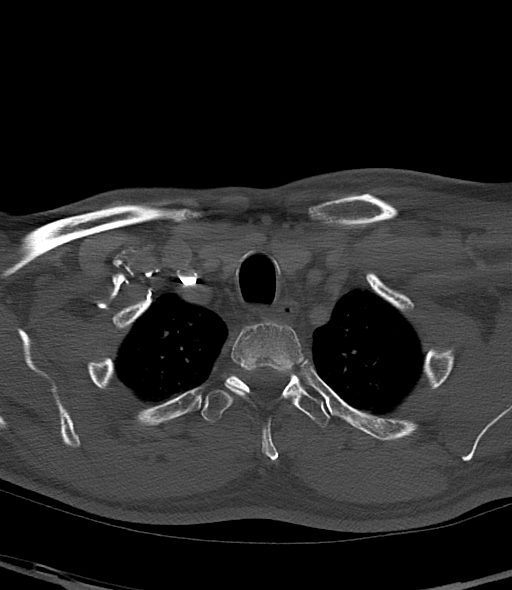
[im 91/136  bone]
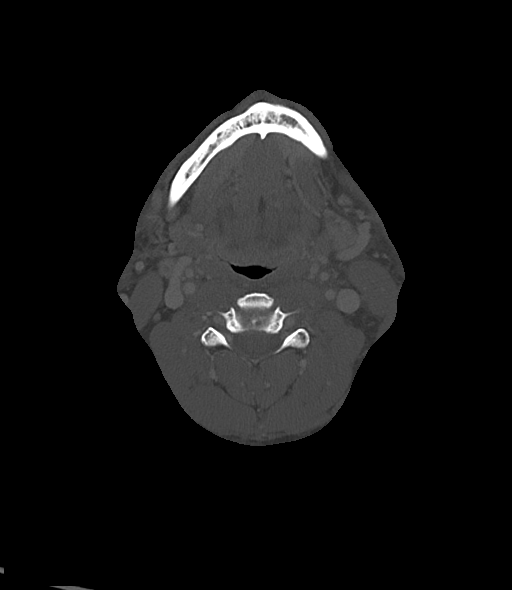

[Series 6: neck 2.00 br36 s3 angled axial (person_name) · axial · 0.46mm/px · z∈[-772,-683]mm · 2 of 136 slices shown]
[im 46/136  bone]
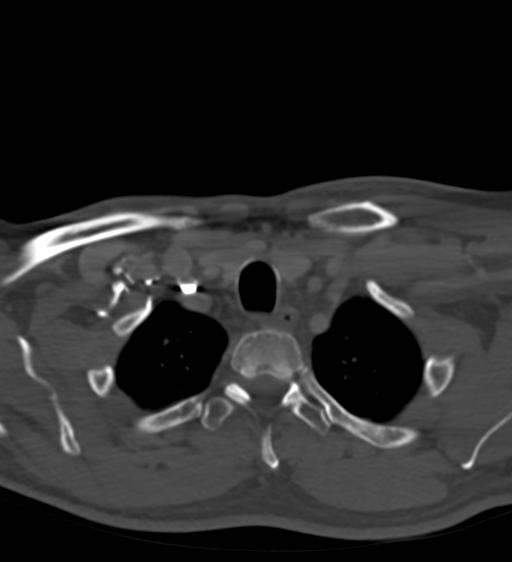
[im 91/136  bone]
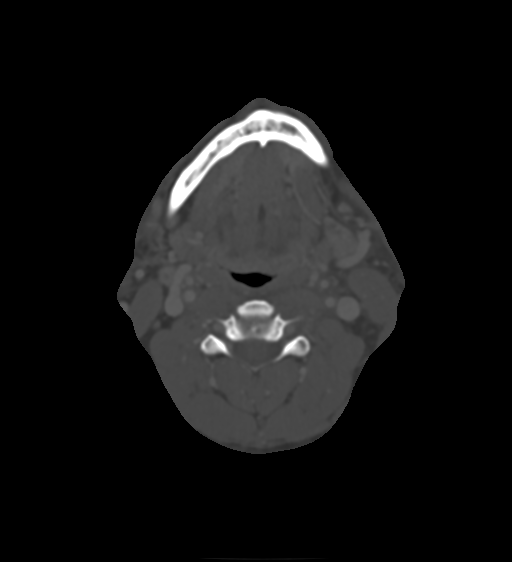

[Series 10: neck 2.00 br40 s3 (person_name) · coronal · 0.46mm/px · 3 of 133 slices shown (1 of 2)]
[im 27/133  bone]
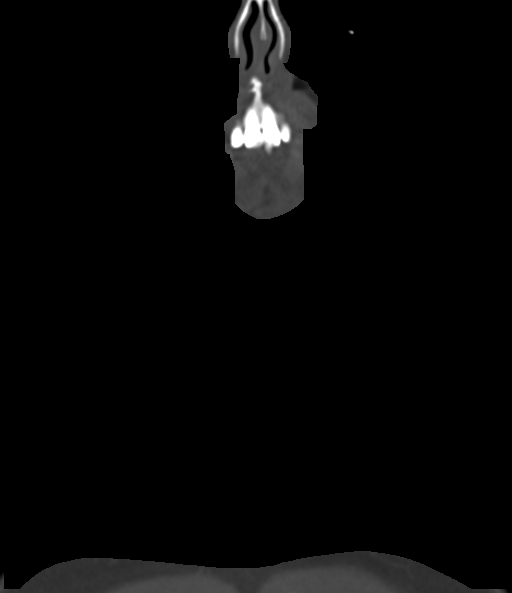
[im 53/133  bone]
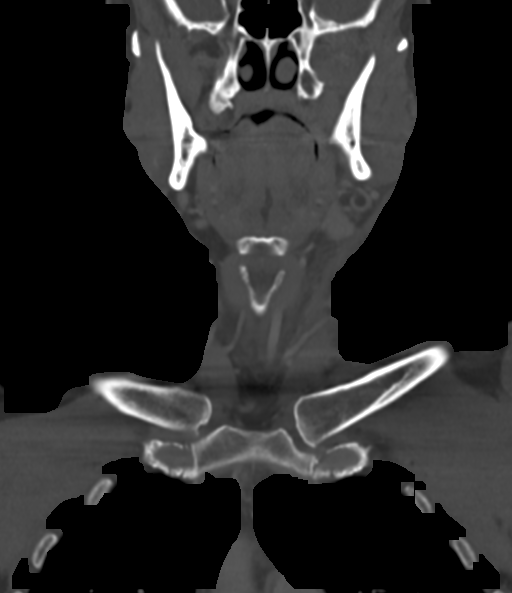
[im 80/133  bone]
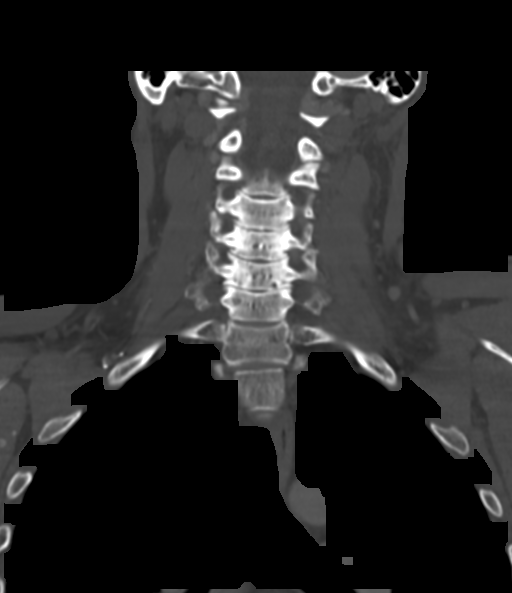

[Series 12: neck 2.00 br40 s3 (person_name) · sagittal · 0.51mm/px · 5 of 117 slices shown, 6 images (2 of 2)]
[im 39/117  bone]
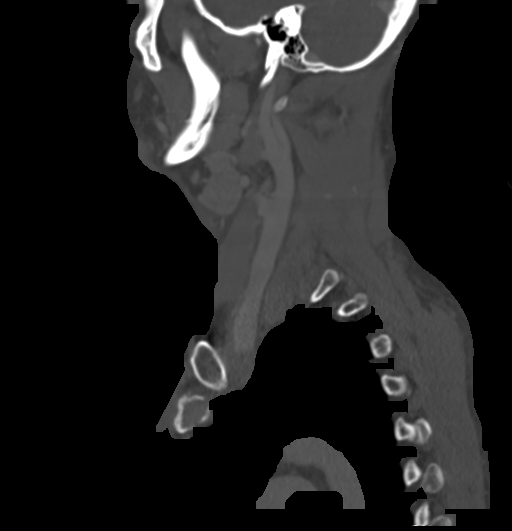
[im 49/117  bone]
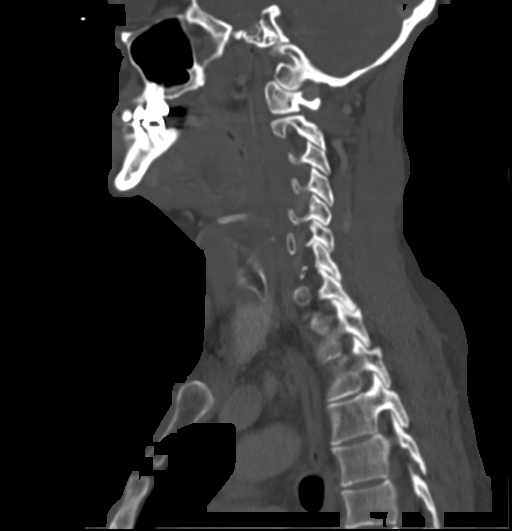
[im 59/117  soft-tissue]
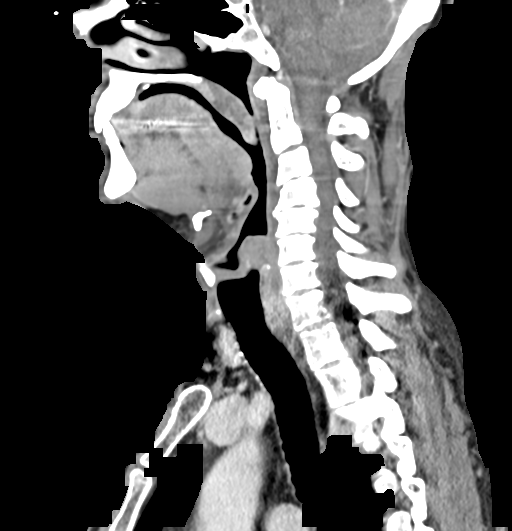
[im 59/117  bone]
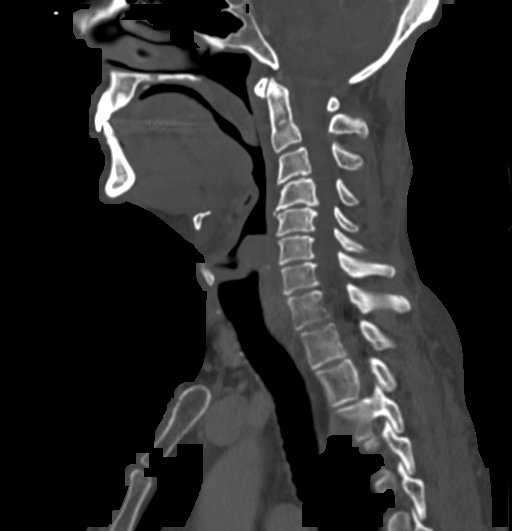
[im 68/117  bone]
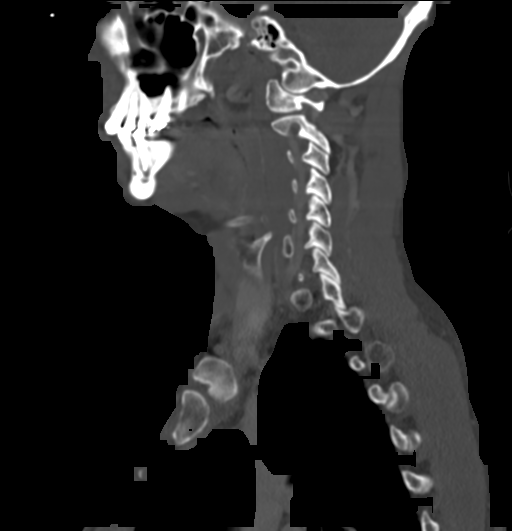
[im 78/117  bone]
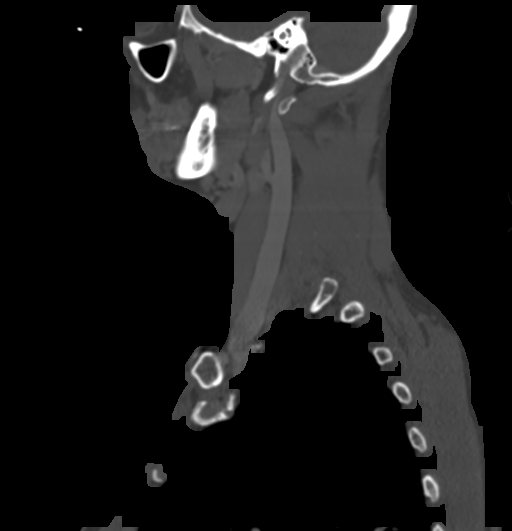

[14 of 35 positions shown; findings below may reference images not displayed]

FINDINGS: Pharynx and larynx: Laryngeal contours are normal. Pharyngeal soft
tissue contours are within normal limits; mild generalized
pharyngeal mucosal thickening might be radiation related.

Negative right parapharyngeal space. Mild retropharyngeal effusion.
Mild left parapharyngeal space stranding.

Salivary glands: Asymmetric heterogeneous enhancement of the left
parotid gland compared to the right (coronal image 68) although no
masslike or measurable areas are identified. Surrounding soft tissue
stranding and asymmetric thickening of the left platysma throughout
the left neck. No left parotid space mass effect.

The right parotid gland is within normal limits.

New pronounced asymmetric enhancement of the left submandibular
gland (series 2, image 51). No sialolithiasis or enlargement of the
left submandibular duct identified. The sublingual and right
submandibular gland appear within normal limits.

Thyroid: Negative.

Lymph nodes: Left level 2 lymph nodes are much smaller but
hyperenhancing, such as on series 2, image 43 (4 millimeter short
axis). That node was previously 7-8 millimeters. There is soft
tissue stranding in the left neck nodal stations and along the left
carotid space. No increased left neck nodes.

Contralateral right side lymph nodes are stable.

Asymmetric soft tissue fullness in the left subclavian region is
stable to mildly improved (coronal image 65). No definite pathologic
nodes there.

Vascular: Soft tissue stranding along the left carotid space but the
major vascular structures in the neck and at the skull base remain
patent.

Limited intracranial: Negative.

Visualized orbits: Negative.

Mastoids and visualized paranasal sinuses: Tympanic cavities and
visible mastoids remain clear. Mild ethmoid mucosal thickening is
stable. Other visible paranasal sinuses are clear.

Skeleton: Stable dentition and osseous structures. Cervical spine
degeneration. No acute or suspicious osseous lesion.

Upper chest: Reported separately today.
IMPRESSION: 1. Radiation and treatment sequelae to the left parotid gland and
left neck.
Heterogeneously enhancing residual left parotid gland but no
mass-like or measurable areas.
Diminutive left neck lymph nodes, and stable to mildly improved
asymmetric soft tissue fullness about the left subclavian
neurovascular bundle.
Overall satisfactory post treatment appearance, but recommend
continued Neck imaging surveillance.
2.  CT Chest today reported separately.

## 2020-06-06 IMAGING — CT CT CHEST WITH CONTRAST
2 of 4 series · 12 of 36 positions shown, 15 images · IV contrast (iopamidol)
Comparison: CT chest 12/21/2018

CLINICAL DATA: History of parotid gland cancer. Restaging status
post surgery and radiation.

EXAM:
CT CHEST WITH CONTRAST
TECHNIQUE: Multidetector CT imaging of the chest was performed during
intravenous contrast administration.
CONTRAST:  120mL [id number] IOPAMIDOL ([id number]) INJECTION 61%

[Series 2: chest 2.00 br40 s3 · axial · 0.60mm/px · z∈[-1040,-768]mm · 9 of 162 slices shown, 12 images (1 of 2)]
[im 13/162  mediastinal]
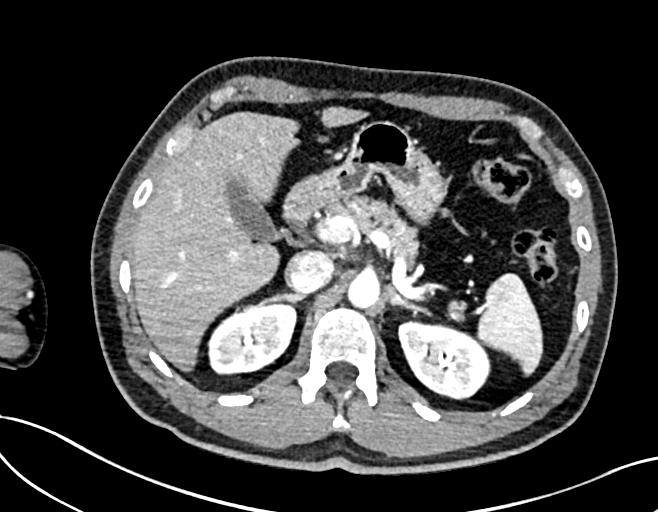
[im 13/162  lung]
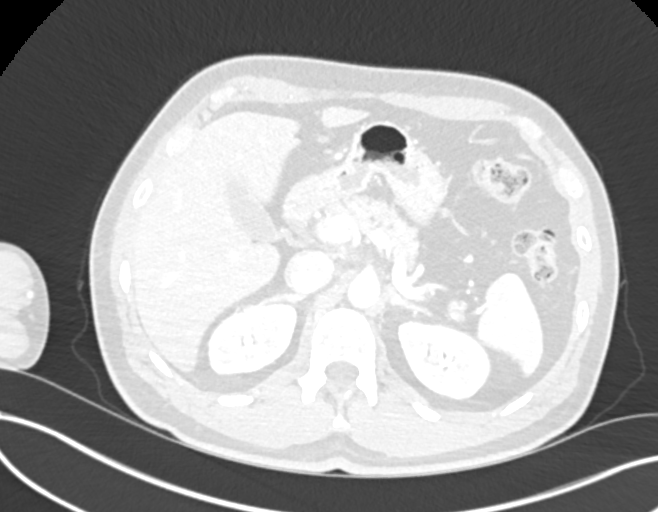
[im 38/162  lung]
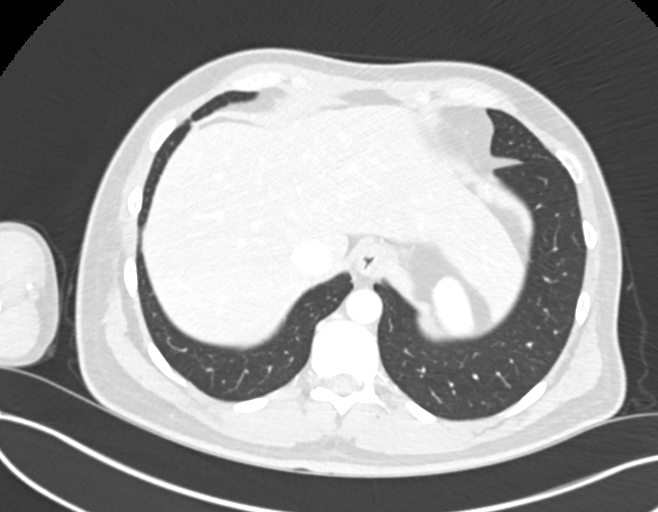
[im 50/162  lung]
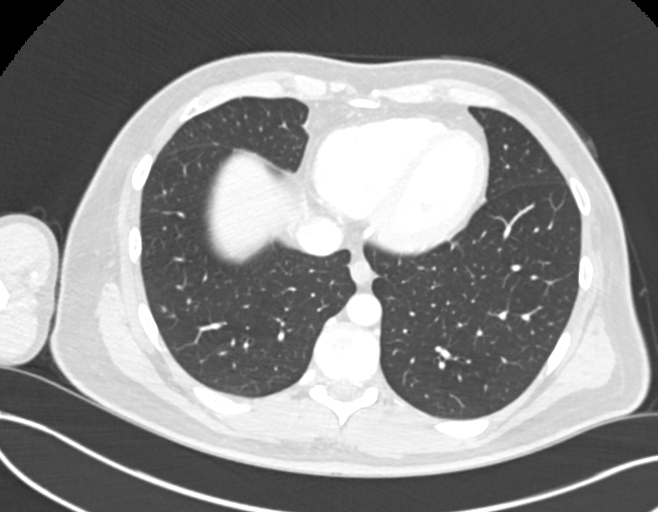
[im 62/162  lung]
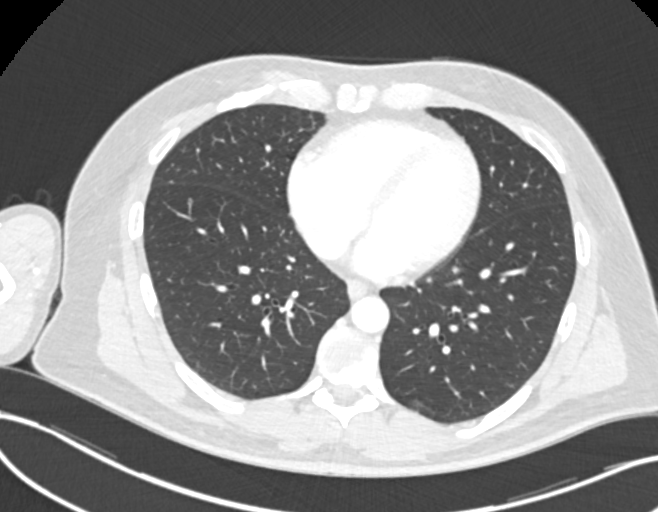
[im 87/162  mediastinal]
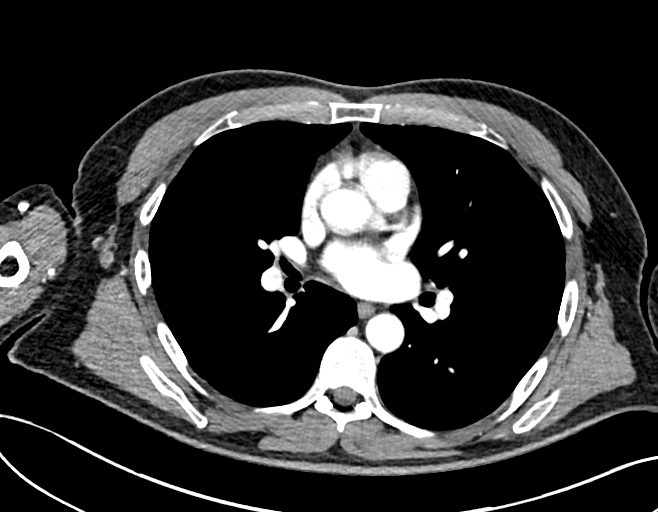
[im 87/162  lung]
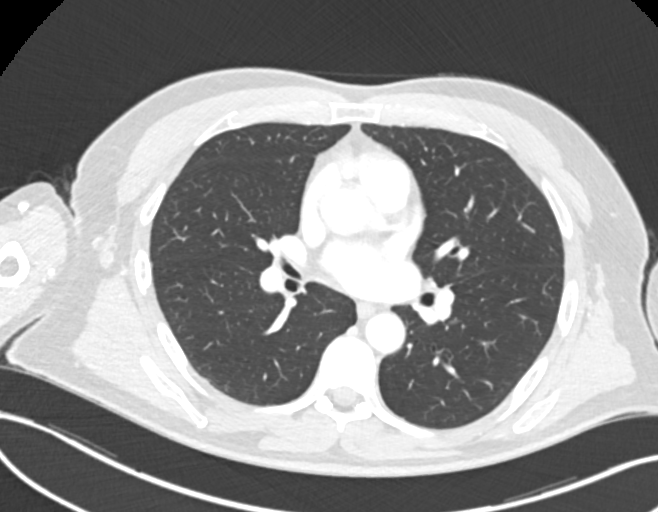
[im 100/162  lung]
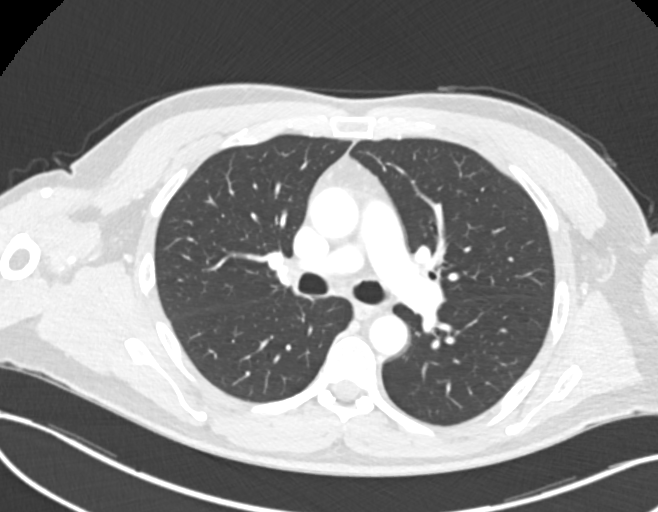
[im 112/162  lung]
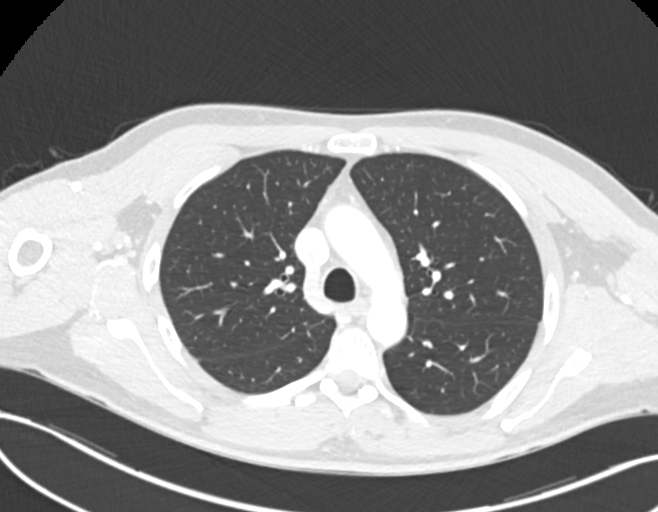
[im 137/162  lung]
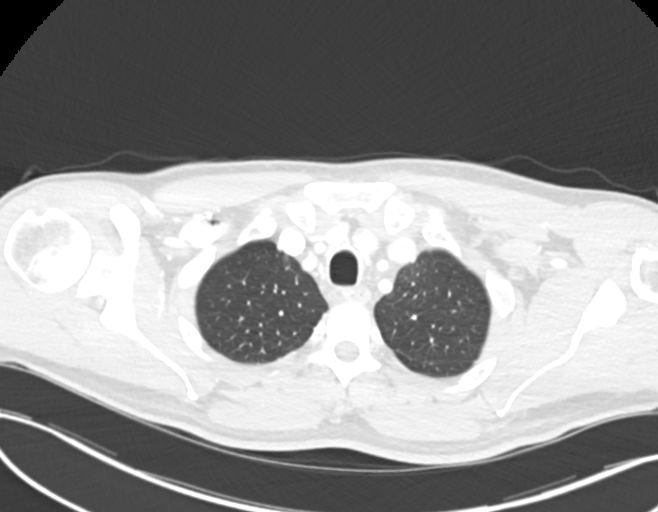
[im 149/162  mediastinal]
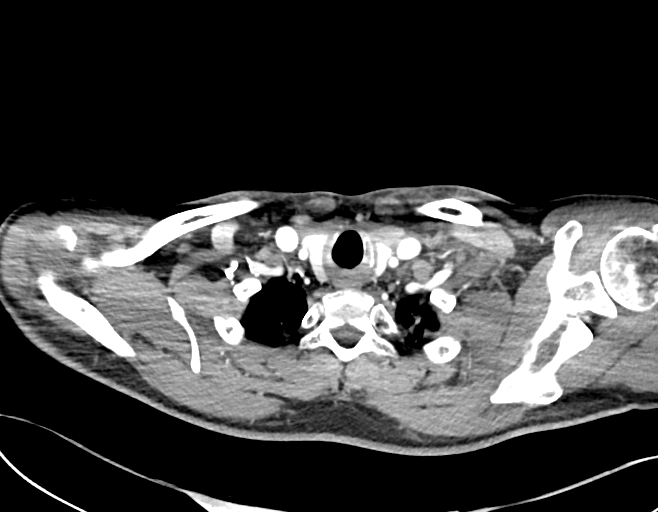
[im 149/162  lung]
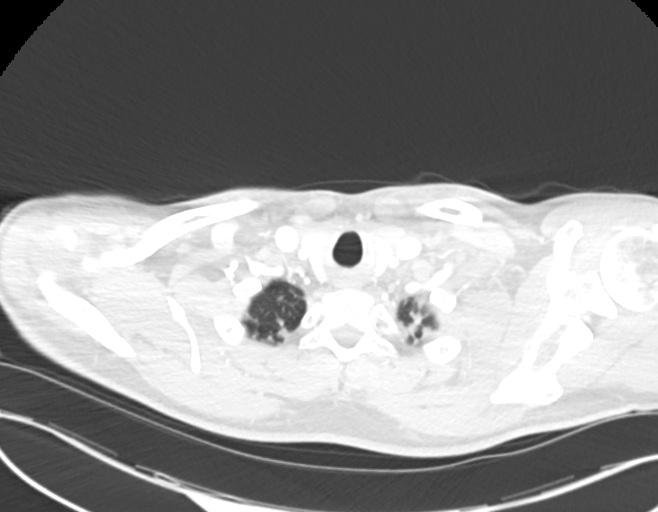

[Series 4: chest 2.00 br40 s3 · coronal · 0.64mm/px · 3 of 154 slices shown (2 of 2)]
[im 31/154  lung]
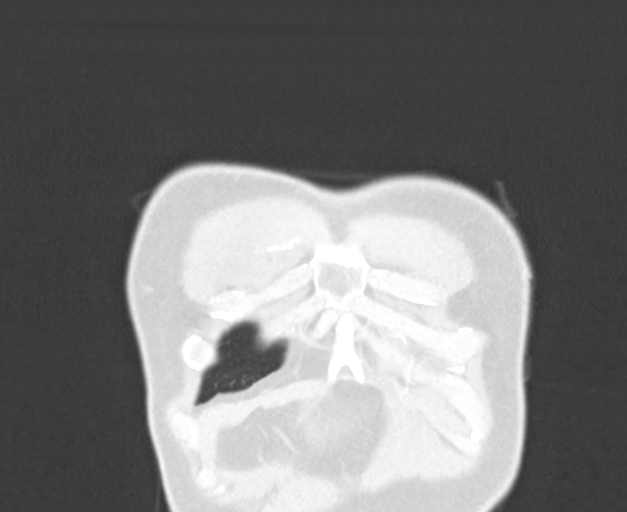
[im 62/154  lung]
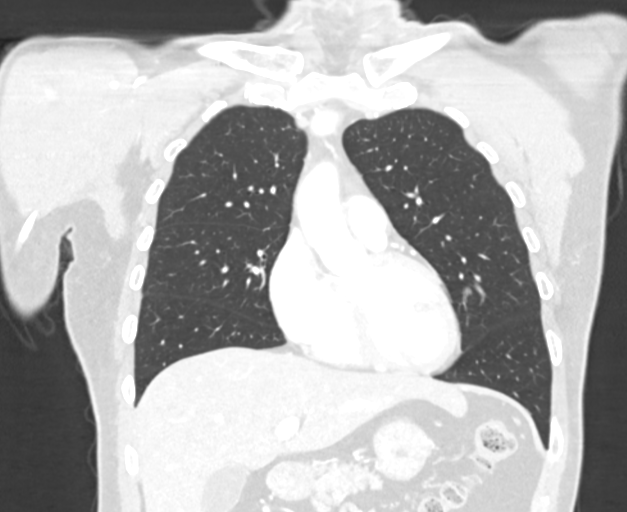
[im 92/154  lung]
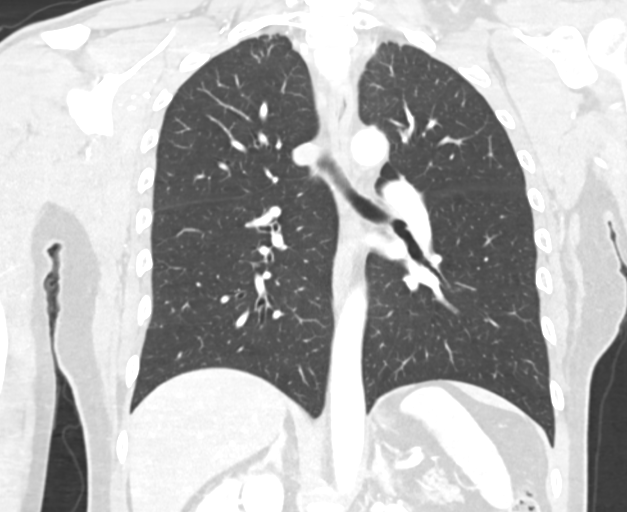

[12 of 36 positions shown; findings below may reference images not displayed]

FINDINGS: Cardiovascular: No significant vascular findings. Normal heart size.
No pericardial effusion.

Mediastinum/Nodes: No enlarged mediastinal, hilar, or axillary lymph
nodes. Thyroid gland, trachea, and esophagus demonstrate no
significant findings.

Lungs/Pleura: No pleural effusion identified. Mild changes of
emphysema.

Several small nodules are again noted:

-Index lung nodule within the posterior left lower lobe measures 3
mm, image 115/8. Unchanged.

-tiny posterolateral right upper lobe lung nodule is unchanged
measuring 2 mm, image 46/8.

-unchanged 4 mm anterior right lower lobe lung nodule, image 103/8.

3 mm lateral left lower lobe lung nodule is unchanged, image 105/8.

-No new pulmonary nodules.

Upper Abdomen: No acute abnormality.

Musculoskeletal: No chest wall abnormality. No acute or significant
osseous findings.
IMPRESSION: 1. Stable small nonspecific pulmonary nodules identified in both
lungs. No new or enlarging pulmonary nodules.

## 2020-06-19 ENCOUNTER — Telehealth: Payer: Self-pay | Admitting: *Deleted

## 2020-06-19 NOTE — Progress Notes (Signed)
Christopher Pacheco presents for follow up of radiation completed on 03/05/2019 to his left parotid area/ head and neck area.   Pain issues, if any: Patient denies Using a feeding tube?: N/A Weight changes, if any:  Wt Readings from Last 3 Encounters:  06/20/20 158 lb 6 oz (71.8 kg)  11/02/19 158 lb (71.7 kg)  06/12/19 160 lb 8 oz (72.8 kg)   Swallowing issues, if any: Occasionally drier food gets stuck in his throat, and he has to drink a lot of water to help ease down. Otherwise though he can eat solids/liquids without issue. Smoking or chewing tobacco? None Using fluoride trays daily? Yes Last ENT visit was on: 02/05/2020 Saw Dr. Melony Overly: "No palpable adenopathy or masses.  He has no palpable masses in the left parotid area.  He has some slight thickening or fullness around the superior aspect of the sternocleidomastoid muscle from previous surgery but no palpable masses.  Of note on the right posterior neck he has a small 5 to 6 mm subcutaneous nodule that is benign on evaluation.  No palpable adenopathy noted. He will follow-up in 6 months for recheck. Refilled his Nasacort"  Other notable issues, if any: Occasional spasm along incisional site on left side of neck. Dry mouth but denies thick saliva. Skin in treatment field looks very well. He states he has begun doing physical activity again that he enjoys (like swimming), and feels over all that he's doing very well.   Vitals:   06/20/20 1200  BP: 107/78  Pulse: (!) 55  Resp: 18  Temp: 98 F (36.7 C)  SpO2: 100%

## 2020-06-19 NOTE — Telephone Encounter (Signed)
Returned patient's phone call, unable to leave message, no vm set up

## 2020-06-20 ENCOUNTER — Ambulatory Visit
Admission: RE | Admit: 2020-06-20 | Discharge: 2020-06-20 | Disposition: A | Discharge status: Home / Self Care | Payer: BC Managed Care – PPO | Source: Ambulatory Visit | Attending: Radiation Oncology | Admitting: Radiation Oncology

## 2020-06-20 ENCOUNTER — Other Ambulatory Visit: Payer: Self-pay

## 2020-06-20 VITALS — BP 107/78 | HR 55 | Temp 98.0°F | Resp 18 | Ht 70.0 in | Wt 158.4 lb

## 2020-06-20 DIAGNOSIS — Z923 Personal history of irradiation: Secondary | ICD-10-CM | POA: Diagnosis not present

## 2020-06-20 DIAGNOSIS — C07 Malignant neoplasm of parotid gland: Secondary | ICD-10-CM

## 2020-06-20 DIAGNOSIS — R682 Dry mouth, unspecified: Secondary | ICD-10-CM | POA: Insufficient documentation

## 2020-06-20 DIAGNOSIS — Z8589 Personal history of malignant neoplasm of other organs and systems: Secondary | ICD-10-CM | POA: Insufficient documentation

## 2020-06-22 ENCOUNTER — Encounter: Payer: Self-pay | Admitting: Radiation Oncology

## 2020-06-22 NOTE — Progress Notes (Signed)
Radiation Oncology         [phone] ________________________________  Name: Christopher Pacheco MRN: [id number]  Date: 06/20/2020  DOB: [birth year]  Follow-Up Visit Note in person  CC: [name], MD  [name], *  Diagnosis and Prior Radiotherapy:       ICD-10-CM   1. Cancer of parotid gland [facility])  C07      Cancer Staging Cancer of parotid gland [facility]) Staging form: Major Salivary Glands, AJCC 8th Edition - Pathologic stage from 01/09/2019: Stage II (pT2, pN0, cM0) - Signed by [name], MD on 01/09/2019  Radiation Treatment Dates: 01/23/2019 through 03/05/2019 Site Technique Total Dose Dose per Fx Completed Fx Beam Energies  Head & neck: HN_Lt_parotid IMRT 60/60 2 30/30 6X   CHIEF COMPLAINT:  Here for follow-up and surveillance of parotid cancer   Mr. Whiters presents for follow up of radiation completed on 03/05/2019 to his left parotid area/ head and neck area.   Pain issues, if any: Patient denies Using a feeding tube?: N/A Weight changes, if any:  Wt Readings from Last 3 Encounters:  06/20/20 158 lb 6 oz (71.8 kg)  11/02/19 158 lb (71.7 kg)  06/12/19 160 lb 8 oz (72.8 kg)   Swallowing issues, if any: Occasionally drier food gets stuck in his throat, and he has to drink a lot of water to help ease down. Otherwise though he can eat solids/liquids without issue. Smoking or chewing tobacco? None Using fluoride trays daily? Yes Last ENT visit was on: 02/05/2020 Saw Dr. Melony Overly: "No palpable adenopathy or masses.  He has no palpable masses in the left parotid area.  He has some slight thickening or fullness around the superior aspect of the sternocleidomastoid muscle from previous surgery but no palpable masses.  Of note on the right posterior neck he has a small 5 to 6 mm subcutaneous nodule that is benign on evaluation.  No palpable adenopathy noted. He will follow-up in 6 months for recheck. Refilled his Nasacort"  Other notable issues, if  any: Occasional spasm along incisional site on left side of neck. Dry mouth but denies thick saliva. Skin in treatment field looks very well. He states he has begun doing physical activity again that he enjoys (like swimming), and feels over all that he's doing very well.   Vitals:   06/20/20 1200  BP: 107/78  Pulse: (!) 55  Resp: 18  Temp: 98 F (36.7 C)  SpO2: 100%    ALLERGIES:  has No Known Allergies.  Meds: Current Outpatient Medications  Medication Sig Dispense Refill  . ibuprofen (ADVIL,MOTRIN) 200 MG tablet Take 200 mg by mouth every 6 (six) hours as needed.    . sodium fluoride (PREVIDENT 5000 PLUS) 1.1 % CREA dental cream Instill cream into fluoride tray. Place over teeth for 5 minutes. Remove. Spit out excess. Do not rinse. Repeat nightly. 1 Tube prn   No current facility-administered medications for this encounter.    Physical Findings: The patient is in no acute distress. Patient is alert and oriented. Wt Readings from Last 3 Encounters:  06/20/20 158 lb 6 oz (71.8 kg)  11/02/19 158 lb (71.7 kg)  06/12/19 160 lb 8 oz (72.8 kg)    height is 5\' 10"  (1.778 m) and weight is 158 lb 6 oz (71.8 kg). His oral temperature is 98 F (36.7 C). His blood pressure is 107/78 and his pulse is 55 (abnormal). His respiration is 18 and oxygen saturation is 100%. Marland Kitchen  General: Alert and oriented, in no acute distress HEENT: Head is normocephalic. Mucosa moist, no oral lesions. Extraocular movements are intact.   Neck: parotid regions/Neck are notable for no masses indicative of adenopathy.  Skin: Skin in treatment fields shows satisfactory healing with no concerning findings Extremities: No cyanosis or edema. Lymphatics: see Neck Exam Psychiatric: Judgment and insight are intact. Affect is appropriate.   Lab Findings: No results found for: WBC, HGB, HCT, MCV, PLT  No results found for: TSH  Radiographic Findings: No results found.  Impression/Plan:    1) Head and Neck  Cancer Status: In remission, no evidence of disease  2) Nutritional Status: good, no issues  3) Dry mouth: managing well with altering diet, sips of liquids  4) We discussed measures to reduce the risk of infection during the COVID-19 pandemic.  He is taking precautions and has been vaccinated.  5) He would like to engage in the survivorship clinic; we will arrange that in 101mo; Will see me back in 43mo.   On date of service, in total, I spent 30 minutes on this encounter. Patient was seen in person.  _____________________________________   [name], MD

## 2020-08-01 ENCOUNTER — Other Ambulatory Visit: Payer: Self-pay

## 2020-08-01 ENCOUNTER — Ambulatory Visit (INDEPENDENT_AMBULATORY_CARE_PROVIDER_SITE_OTHER): Payer: BC Managed Care – PPO | Admitting: Otolaryngology

## 2020-08-01 VITALS — Temp 97.5°F

## 2020-08-01 DIAGNOSIS — Z85818 Personal history of malignant neoplasm of other sites of lip, oral cavity, and pharynx: Secondary | ICD-10-CM

## 2020-08-01 NOTE — Progress Notes (Signed)
HPI: Christopher Pacheco is a 59 y.o. 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  He received postoperative radiation therapy.  He has been doing well with no specific complaints except for occasional spasm of the muscles in the left neck as well as occasional swelling which only lasts for a couple of hours..  Past Medical History:  Diagnosis Date  . Allergy   . Diffuse infection of pancreas    30 years ago  . Diverticulitis   . Fatty liver    Dr. Watt Climes  . History of radiation therapy 01/23/19- 03/05/19   Left Parotid/ head and neck radiation, 30 fractions of 2 Gy each for total of 60 Gy.   . Medical history non-contributory    Past Surgical History:  Procedure Laterality Date  . COLONOSCOPY    . PAROTIDECTOMY Left 12/01/2018   Procedure: PAROTIDECTOMY;  Surgeon: [name], MD;  Location: [facility];  Service: ENT;  Laterality: Left;   Social History   Socioeconomic History  . Marital status: Married    Spouse name: Not on file  . Number of children: Not on file  . Years of education: Not on file  . Highest education level: Not on file  Occupational History  . Not on file  Tobacco Use  . Smoking status: Never Smoker  . Smokeless tobacco: Never Used  Vaping Use  . Vaping Use: Never used  Substance and Sexual Activity  . Alcohol use: Yes    Alcohol/week: 2.0 standard drinks    Types: 2 Glasses of wine per week    Comment: weekends  . Drug use: Never  . Sexual activity: Not on file  Other Topics Concern  . Not on file  Social History Narrative   Left-handed. 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 He swims three days a week and plays the violin.   Social Determinants of Health   Financial Resource Strain:    . Difficulty of Paying Living Expenses: Not on file  Food Insecurity:   . Worried About Charity fundraiser in the Last Year: Not on file  . Ran Out of Food in the Last Year: Not on file  Transportation Needs:   . Lack of Transportation (Medical): Not on file  . Lack of Transportation (Non-Medical): Not on file  Physical Activity:   . Days of Exercise per Week: Not on file  . Minutes of Exercise per Session: Not on file  Stress:   . Feeling of Stress : Not on file  Social Connections:   . Frequency of Communication with Friends and Family: Not on file  . Frequency of Social Gatherings with Friends and Family: Not on file  . Attends Religious Services: Not on file  . Active Member of Clubs or Organizations: Not on file  . Attends Archivist Meetings: Not on file  . Marital Status: Not on file   No family history on file. No Known Allergies Prior to Admission medications   Medication Sig Start Date End Date Taking? Authorizing Provider  ibuprofen (ADVIL,MOTRIN) 200 MG tablet Take 200 mg by mouth every 6 (six) hours as needed.   Yes [provider]  sodium fluoride (PREVIDENT 5000 PLUS) 1.1 % CREA dental cream Instill cream into fluoride  tray. Place over teeth for 5 minutes. Remove. Spit out excess. Do not rinse. Repeat nightly. 01/09/19  Yes Lenn Cal, DDS     Positive ROS: Otherwise negative  All other systems have been reviewed and were otherwise negative with the exception of those mentioned in the HPI and as above.  Physical Exam: Constitutional: Alert, well-appearing, no acute distress Ears: External ears without lesions or tenderness. Ear canals are clear bilaterally with intact, clear TMs.  Bilaterally. Nasal: External nose without lesions. Septum is mildly deviated but clear nasal passages otherwise he is having no difficulty breathing.. Clear nasal passages Oral: Lips and gums without lesions. Tongue and palate mucosa without lesions. Posterior  oropharynx clear. Neck: No palpable adenopathy or masses.  He has normal facial nerve function on the left side.  He has no palpable masses or lymph nodes in the left neck or left parotid region. Respiratory: Breathing comfortably  Skin: No facial/neck lesions or rash noted.  Procedures  Assessment: 20 months status post left parotidectomy and postop radiation therapy for adenocarcinoma of the left parotid gland.  No evidence of recurrent or persistent disease on clinical exam today.  Plan: He will follow up here in 3 to 4 months for recheck.  He apparently has a follow-up appointment scheduled with Dr. Isidore Moos next April.   Radene Journey, MD

## 2020-10-21 ENCOUNTER — Inpatient Hospital Stay: Payer: BC Managed Care – PPO | Admitting: Medical

## 2020-10-21 ENCOUNTER — Inpatient Hospital Stay: Payer: BC Managed Care – PPO | Attending: Medical | Admitting: Medical

## 2020-10-21 ENCOUNTER — Other Ambulatory Visit: Payer: Self-pay

## 2020-12-17 ENCOUNTER — Other Ambulatory Visit: Payer: Self-pay | Admitting: Family Medicine

## 2020-12-17 DIAGNOSIS — R109 Unspecified abdominal pain: Secondary | ICD-10-CM

## 2020-12-31 ENCOUNTER — Other Ambulatory Visit: Payer: Self-pay

## 2021-01-01 ENCOUNTER — Ambulatory Visit (INDEPENDENT_AMBULATORY_CARE_PROVIDER_SITE_OTHER): Payer: BC Managed Care – PPO | Admitting: Otolaryngology

## 2021-01-01 ENCOUNTER — Encounter (INDEPENDENT_AMBULATORY_CARE_PROVIDER_SITE_OTHER): Payer: Self-pay | Admitting: Otolaryngology

## 2021-01-01 ENCOUNTER — Other Ambulatory Visit: Payer: Self-pay

## 2021-01-01 VITALS — Temp 97.5°F

## 2021-01-01 DIAGNOSIS — Z85818 Personal history of malignant neoplasm of other sites of lip, oral cavity, and pharynx: Secondary | ICD-10-CM

## 2021-01-01 NOTE — Progress Notes (Signed)
HPI: Christopher Pacheco is a 60 y.o. male who returns today for evaluation of adenocarcinoma of the left parotid gland status post surgical excision of a 3.2 cm adenocarcinoma performed on 12/01/2018 followed by postoperative radiation therapy completed in May 2020.  He has been doing well with no specific complaints..  Past Medical History:  Diagnosis Date  . Allergy   . Diffuse infection of pancreas    30 years ago  . Diverticulitis   . Fatty liver    Dr. Watt Climes  . History of radiation therapy 01/23/19- 03/05/19   Left Parotid/ head and neck radiation, 30 fractions of 2 Gy each for total of 60 Gy.   . Medical history non-contributory    Past Surgical History:  Procedure Laterality Date  . COLONOSCOPY    . PAROTIDECTOMY Left 12/01/2018   Procedure: PAROTIDECTOMY;  Surgeon: [name], MD;  Location: [facility];  Service: ENT;  Laterality: Left;   Social History   Socioeconomic History  . Marital status: Married    Spouse name: Not on file  . Number of children: Not on file  . Years of education: Not on file  . Highest education level: Not on file  Occupational History  . Not on file  Tobacco Use  . Smoking status: Never Smoker  . Smokeless tobacco: Never Used  Vaping Use  . Vaping Use: Never used  Substance and Sexual Activity  . Alcohol use: Yes    Alcohol/week: 2.0 standard drinks    Types: 2 Glasses of wine per week    Comment: weekends  . Drug use: Never  . Sexual activity: Not on file  Other Topics Concern  . Not on file  Social History Narrative   Left-handed. 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 He swims three days a week and plays the violin.   Social Determinants of Health   Financial Resource Strain: Not on file  Food Insecurity: Not on file  Transportation Needs: Not on file  Physical Activity: Not on file  Stress: Not on file  Social  Connections: Not on file   No family history on file. No Known Allergies Prior to Admission medications   Medication Sig Start Date End Date Taking? Authorizing Provider  ibuprofen (ADVIL,MOTRIN) 200 MG tablet Take 200 mg by mouth every 6 (six) hours as needed.    [provider]  sodium fluoride (PREVIDENT 5000 PLUS) 1.1 % CREA dental cream Instill cream into fluoride tray. Place over teeth for 5 minutes. Remove. Spit out excess. Do not rinse. Repeat nightly. 01/09/19   Lenn Cal, DDS     Positive ROS: Otherwise negative  All other systems have been reviewed and were otherwise negative with the exception of those mentioned in the HPI and as above.  Physical Exam: Constitutional: Alert, well-appearing, no acute distress Ears: External ears without lesions or tenderness.  He has minimal wax buildup in his ear canals that is nonobstructing.  TMs are clear. Nasal: External nose without lesions. Clear nasal passages Oral: Lips and gums without lesions. Tongue and palate mucosa without lesions. Posterior oropharynx clear. Neck: No palpable adenopathy or masses.  Palpation of the left parotid area reveals no parotid masses.  There is no adenopathy in the neck on either side. Normal facial nerve function on the left side Respiratory: Breathing comfortably  Skin: No facial/neck lesions or rash noted.  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  Patient doing well with no evidence of recurrent disease.  Plan: He will follow up here in 6 months for recheck.   Radene Journey, MD

## 2021-02-17 ENCOUNTER — Telehealth: Payer: Self-pay | Admitting: *Deleted

## 2021-02-17 NOTE — Telephone Encounter (Signed)
CALLED PATIENT TO ASK ABOUT ALTERING FU ON 02-18-21, PATIENT AGREED TO COME ON 02-20-21 @ 3:20 PM

## 2021-02-18 ENCOUNTER — Ambulatory Visit: Payer: BC Managed Care – PPO | Admitting: Radiation Oncology

## 2021-02-20 ENCOUNTER — Ambulatory Visit
Admission: RE | Admit: 2021-02-20 | Discharge: 2021-02-20 | Disposition: A | Discharge status: Home / Self Care | Payer: BC Managed Care – PPO | Source: Ambulatory Visit | Attending: Radiation Oncology | Admitting: Radiation Oncology

## 2021-02-20 ENCOUNTER — Other Ambulatory Visit: Payer: Self-pay

## 2021-02-20 VITALS — BP 101/65 | HR 82 | Temp 97.7°F | Resp 18 | Wt 162.0 lb

## 2021-02-20 DIAGNOSIS — R682 Dry mouth, unspecified: Secondary | ICD-10-CM | POA: Insufficient documentation

## 2021-02-20 DIAGNOSIS — Z8589 Personal history of malignant neoplasm of other organs and systems: Secondary | ICD-10-CM | POA: Diagnosis not present

## 2021-02-20 DIAGNOSIS — Z923 Personal history of irradiation: Secondary | ICD-10-CM | POA: Diagnosis not present

## 2021-02-20 DIAGNOSIS — C07 Malignant neoplasm of parotid gland: Secondary | ICD-10-CM

## 2021-02-20 DIAGNOSIS — R5383 Other fatigue: Secondary | ICD-10-CM | POA: Insufficient documentation

## 2021-02-20 NOTE — Progress Notes (Signed)
Christopher Pacheco presents for follow up of radiation completed on 03/05/2019 to his left parotid area/ head and neck area.  Pain issues, if any: Reports occasional spasms to left side of neck, but states they resolve on their own quickly (usually happens with movement) Using a feeding tube?: N/A Weight changes, if any:  Wt Readings from Last 3 Encounters:  02/20/21 162 lb (73.5 kg)  06/20/20 158 lb 6 oz (71.8 kg)  11/02/19 158 lb (71.7 kg)   Swallowing issues, if any: Patient denies. Reports he is able to eat a varied diet without issue Smoking or chewing tobacco? None Using fluoride trays daily? Yes--every night, also sees his community dentist every 6 months Last ENT visit was on: 01/01/2021 Saw Dr. 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  Patient doing well with no evidence of recurrent disease. --Plan: He will follow up here in 6 months for recheck"  Other notable issues, if any: Continues to deal with dry mouth and thickened saliva. Denies any symptoms of swelling to his jaw/neck. Reports occasional fatigue, but states it is manageable. Overall he reports he feels well and is pleased with his recovery thus far  Vitals:   02/20/21 1511  BP: 101/65  Pulse: 82  Resp: 18  Temp: 97.7 F (36.5 C)  SpO2: 100%

## 2021-02-23 ENCOUNTER — Telehealth: Payer: Self-pay | Admitting: *Deleted

## 2021-02-23 ENCOUNTER — Encounter: Payer: Self-pay | Admitting: Radiation Oncology

## 2021-02-23 NOTE — Telephone Encounter (Signed)
CALLED PATIENT TO INFORM OF FU WITH DR. Isidore Moos ON 02-26-22 @ 4 PM, NO ANSWER MAILED APPT. CARD

## 2021-02-23 NOTE — Progress Notes (Signed)
Radiation Oncology         [phone] ________________________________  Name: Christopher Pacheco MRN: [id number]  Date: 02/20/2021  DOB: Sep 26, 1961  Follow-Up Visit Note in person  CC: [name], MD  [name], *  Diagnosis and Prior Radiotherapy:       ICD-10-CM   1. Cancer of parotid gland [facility])  C07      Cancer Staging Cancer of parotid gland [facility]) Staging form: Major Salivary Glands, AJCC 8th Edition - Pathologic stage from 01/09/2019: Stage II (pT2, pN0, cM0) - Signed by [name], MD on 01/09/2019 Laterality: Left Lymph-vascular invasion (LVI): LVI not present (absent)/not identified  Radiation Treatment Dates: 01/23/2019 through 03/05/2019 Site Technique Total Dose Dose per Fx Completed Fx Beam Energies  Head & neck: HN_Lt_parotid IMRT 60/60 2 30/30 6X   CHIEF COMPLAINT:  Here for follow-up and surveillance of parotid cancer  Mr. Celaya presents for follow up of radiation completed on 03/05/2019 to his left parotid area/ head and neck area.  Pain issues, if any: Reports occasional spasms to left side of neck, but states they resolve on their own quickly (usually happens with movement) Using a feeding tube?: N/A Weight changes, if any:  Wt Readings from Last 3 Encounters:  02/20/21 162 lb (73.5 kg)  06/20/20 158 lb 6 oz (71.8 kg)  11/02/19 158 lb (71.7 kg)   Swallowing issues, if any: Patient denies. Reports he is able to eat a varied diet without issue Smoking or chewing tobacco? None Using fluoride trays daily? Yes--every night, also sees his community dentist every 6 months Last ENT visit was on: 01/01/2021 Saw Dr. 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  Patient doing well with no evidence of recurrent disease. --Plan: He will follow up here in 6 months for recheck"  Other notable issues, if any: Continues to deal  with dry mouth and thickened saliva. Denies any symptoms of swelling to his jaw/neck. Reports occasional fatigue, but states it is manageable. Overall he reports he feels well and is pleased with his recovery thus far    Vitals:   02/20/21 1511  BP: 101/65  Pulse: 82  Resp: 18  Temp: 97.7 F (36.5 C)  SpO2: 100%    ALLERGIES:  has No Known Allergies.  Meds: Current Outpatient Medications  Medication Sig Dispense Refill  . sodium fluoride (PREVIDENT 5000 PLUS) 1.1 % CREA dental cream Instill cream into fluoride tray. Place over teeth for 5 minutes. Remove. Spit out excess. Do not rinse. Repeat nightly. 1 Tube prn  . triamcinolone (NASACORT ALLERGY 24HR) 55 MCG/ACT AERO nasal inhaler Place 1 spray into the nose daily.    Marland Kitchen ibuprofen (ADVIL,MOTRIN) 200 MG tablet Take 200 mg by mouth every 6 (six) hours as needed.     No current facility-administered medications for this encounter.    Physical Findings: The patient is in no acute distress. Patient is alert and oriented. Wt Readings from Last 3 Encounters:  02/20/21 162 lb (73.5 kg)  06/20/20 158 lb 6 oz (71.8 kg)  11/02/19 158 lb (71.7 kg)    weight is 162 lb (73.5 kg). His temperature is 97.7 F (36.5 C). His blood pressure is 101/65 and his pulse is 82. His respiration is 18 and oxygen saturation is 100%. .    General: Alert and oriented, in no acute distress HEENT: Head is normocephalic.  Extraocular movements are  intact.   Neck: parotid regions/Neck are notable for no masses indicative of adenopathy.  Skin: Skin in treatment fields shows satisfactory healing with no concerning findings Heart RRR Chest CTAB Extremities: No cyanosis or edema. Lymphatics: see Neck Exam Psychiatric: Judgment and insight are intact. Affect is appropriate.   Lab Findings: No results found for: WBC, HGB, HCT, MCV, PLT  No results found for: TSH  Radiographic Findings: No results found.  Impression/Plan:    1) Head and Neck Cancer  Status: In remission, no evidence of disease  2) Nutritional Status: good, no issues  3) Dry mouth: managing well with altering diet, sips of liquids  4) Continue f/u w ENT. I will see him in 1 yr.  5). His energy waxes and wanes and he'd like to be proactive in addressing this. Most likely this is normal. I recommended he discuss getting a TSH w/ his PCP.  Our labs was closed this PM when he was here so I could not order one today.  His thyroid did not get significant exposure to RT, but it's reasonable for him to check TSHs annually as hypothyroidism is not rare.  On date of service, in total, I spent 25 minutes on this encounter. Patient was seen in person.  _____________________________________   [name], MD

## 2021-06-23 ENCOUNTER — Emergency Department (HOSPITAL_COMMUNITY)
Admission: EM | Admit: 2021-06-23 | Discharge: 2021-06-23 | Disposition: A | Discharge status: Home / Self Care | Payer: BC Managed Care – PPO | Attending: Emergency Medicine | Admitting: Emergency Medicine

## 2021-06-23 ENCOUNTER — Emergency Department (HOSPITAL_COMMUNITY): Payer: BC Managed Care – PPO

## 2021-06-23 ENCOUNTER — Encounter (HOSPITAL_COMMUNITY): Payer: Self-pay | Admitting: *Deleted

## 2021-06-23 DIAGNOSIS — K5792 Diverticulitis of intestine, part unspecified, without perforation or abscess without bleeding: Secondary | ICD-10-CM | POA: Diagnosis not present

## 2021-06-23 DIAGNOSIS — Z85858 Personal history of malignant neoplasm of other endocrine glands: Secondary | ICD-10-CM | POA: Insufficient documentation

## 2021-06-23 DIAGNOSIS — R1012 Left upper quadrant pain: Secondary | ICD-10-CM | POA: Diagnosis present

## 2021-06-23 LAB — URINALYSIS, ROUTINE W REFLEX MICROSCOPIC
Bilirubin Urine: NEGATIVE
Glucose, UA: NEGATIVE mg/dL
Hgb urine dipstick: NEGATIVE
Ketones, ur: NEGATIVE mg/dL
Leukocytes,Ua: NEGATIVE
Nitrite: NEGATIVE
Protein, ur: NEGATIVE mg/dL
Specific Gravity, Urine: 1.004 — ABNORMAL LOW (ref 1.005–1.030)
pH: 6 (ref 5.0–8.0)

## 2021-06-23 LAB — CBC
HCT: 39.9 % (ref 39.0–52.0)
Hemoglobin: 13.3 g/dL (ref 13.0–17.0)
MCH: 30.7 pg (ref 26.0–34.0)
MCHC: 33.3 g/dL (ref 30.0–36.0)
MCV: 92.1 fL (ref 80.0–100.0)
Platelets: 212 10*3/uL (ref 150–400)
RBC: 4.33 MIL/uL (ref 4.22–5.81)
RDW: 12.9 % (ref 11.5–15.5)
WBC: 3.7 10*3/uL — ABNORMAL LOW (ref 4.0–10.5)
nRBC: 0 % (ref 0.0–0.2)

## 2021-06-23 LAB — COMPREHENSIVE METABOLIC PANEL
ALT: 27 U/L (ref 0–44)
AST: 23 U/L (ref 15–41)
Albumin: 4.2 g/dL (ref 3.5–5.0)
Alkaline Phosphatase: 59 U/L (ref 38–126)
Anion gap: 6 (ref 5–15)
BUN: 12 mg/dL (ref 6–20)
CO2: 27 mmol/L (ref 22–32)
Calcium: 9.2 mg/dL (ref 8.9–10.3)
Chloride: 105 mmol/L (ref 98–111)
Creatinine, Ser: 0.82 mg/dL (ref 0.61–1.24)
GFR, Estimated: >60 mL/min (ref >60)
Glucose, Bld: 128 mg/dL — ABNORMAL HIGH (ref 70–99)
Potassium: 3.9 mmol/L (ref 3.5–5.1)
Sodium: 138 mmol/L (ref 135–145)
Total Bilirubin: 0.8 mg/dL (ref 0.3–1.2)
Total Protein: 7.3 g/dL (ref 6.5–8.1)

## 2021-06-23 LAB — LIPASE, BLOOD: Lipase: 39 U/L (ref 11–51)

## 2021-06-23 MED ORDER — IOHEXOL 350 MG/ML SOLN
80.0000 mL | Freq: Once | INTRAVENOUS | Status: AC | PRN
  Administered 2021-06-23: 80 mL via INTRAVENOUS

## 2021-06-23 MED ORDER — AMOXICILLIN-POT CLAVULANATE 875-125 MG PO TABS: 1.0000 | ORAL_TABLET | Freq: Two times a day (BID) | ORAL | 0 refills | Status: AC

## 2021-06-23 MED ORDER — AMOXICILLIN-POT CLAVULANATE 875-125 MG PO TABS
1.0000 | ORAL_TABLET | Freq: Once | ORAL | Status: AC
  Administered 2021-06-23: 1 via ORAL
  Filled 2021-06-23: qty 1

## 2021-06-23 NOTE — ED Triage Notes (Signed)
Pt complains of left abdominal pain. He noticed the pain yesterday while swimming. He noticed pain in the same area previously when he wasn't swimming. No n/v/d. Normal BM, no blood in stool.

## 2021-06-23 NOTE — ED Provider Notes (Signed)
[place] DEPT Provider Note   CSN: [id number] Arrival date & time: 06/23/21  1331     History Chief Complaint  Patient presents with   Abdominal Pain    TREVIUS KEMPER is a 60 y.o. male.  Patient is a 60 year old male with past med history as seen below presenting for complaints of abdominal pain.  Patient admits to left upper and left lower quadrant abdominal pain that began over the past several days.  Describes pain as dull, aching, nonradiating, intermittent, occurs with movement and at rest.  States pain is currently resolved at this time.  Initially began while swimming several days ago and reoccurred last night taking in severe pain and an episode of diaphoresis.  She denies any chest pain or shortness of breath.  Previous history of pancreatic infection.  Denies fevers, chills, nausea, vomiting.  The history is provided by the patient. No language interpreter was used.  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 Watt Climes   History of radiation therapy 01/23/19- 03/05/19   Left Parotid/ head and neck radiation, 30 fractions of 2 Gy each for total of 60 Gy.    Medical history non-contributory     Patient Active Problem List   Diagnosis Date Noted   Cancer of parotid gland ([place]) 01/09/2019   Parotid mass 12/01/2018    Past Surgical History:  Procedure Laterality Date   COLONOSCOPY     PAROTIDECTOMY Left 12/01/2018   Procedure: PAROTIDECTOMY;  Surgeon: [name], MD;  Location: [facility];  Service: ENT;   Laterality: Left;       No family history on file.  Social History   Tobacco Use   Smoking status: Never   Smokeless tobacco: Never  Vaping Use   Vaping Use: Never used  Substance Use Topics   Alcohol use: Yes    Alcohol/week: 2.0 standard drinks    Types: 2 Glasses of wine per week    Comment: weekends   Drug use: Never    Home Medications Prior to Admission medications   Medication Sig Start Date End Date Taking? Authorizing Provider  amoxicillin-clavulanate (AUGMENTIN) 875-125 MG tablet Take 1 tablet by mouth every 12 (twelve) hours for 10 days. 06/23/21 [id number] Yes Campbell Stall P, DO  triamcinolone (NASACORT) 55 MCG/ACT AERO nasal inhaler Place 1 spray into the nose 2 (two) times daily as needed (allergies).   Yes [provider]    Allergies    Patient has no known allergies.  Review of Systems   Review of Systems  Constitutional:  Negative for chills and fever.  HENT:  Negative for ear pain and sore throat.   Eyes:  Negative for pain and visual disturbance.  Respiratory:  Negative for cough and shortness of breath.   Cardiovascular:  Negative for chest pain and palpitations.  Gastrointestinal:  Positive for abdominal pain. Negative for diarrhea, nausea and vomiting.  Genitourinary:  Negative for dysuria and hematuria.  Musculoskeletal:  Negative for  arthralgias and back pain.  Skin:  Negative for color change and rash.  Neurological:  Negative for seizures and syncope.  All other systems reviewed and are negative.  Physical Exam Updated Vital Signs BP 116/76   Pulse 63   Temp 98.2 F (36.8 C) (Oral)   Resp 18   SpO2 100%   Physical Exam Vitals and nursing note reviewed.  Constitutional:      Appearance: He is well-developed.  HENT:     Head: Normocephalic and atraumatic.  Eyes:     Conjunctiva/sclera: Conjunctivae normal.  Cardiovascular:     Rate and Rhythm: Normal rate and regular rhythm.     Heart sounds: No murmur heard. Pulmonary:      Effort: Pulmonary effort is normal. No respiratory distress.     Breath sounds: Normal breath sounds.  Abdominal:     Palpations: Abdomen is soft.     Tenderness: There is no abdominal tenderness.  Musculoskeletal:     Cervical back: Neck supple.  Skin:    General: Skin is warm and dry.  Neurological:     Mental Status: He is alert.    ED Results / Procedures / Treatments   Labs (all labs ordered are listed, but only abnormal results are displayed) Labs Reviewed  COMPREHENSIVE METABOLIC PANEL - Abnormal; Notable for the following components:      Result Value   Glucose, Bld 128 (*)    All other components within normal limits  CBC - Abnormal; Notable for the following components:   WBC 3.7 (*)    All other components within normal limits  URINALYSIS, ROUTINE W REFLEX MICROSCOPIC - Abnormal; Notable for the following components:   Color, Urine STRAW (*)    Specific Gravity, Urine 1.004 (*)    All other components within normal limits  LIPASE, BLOOD    EKG None  Radiology CT Abdomen Pelvis W Contrast  Result Date: 06/23/2021 CLINICAL DATA:  Left-sided abdominal pain EXAM: CT ABDOMEN AND PELVIS WITH CONTRAST TECHNIQUE: Multidetector CT imaging of the abdomen and pelvis was performed using the standard protocol following bolus administration of intravenous contrast. CONTRAST:  100m OMNIPAQUE IOHEXOL 350 MG/ML SOLN COMPARISON:  CT September 24, 2013 FINDINGS: Lower chest: No acute abnormality. Hepatobiliary: No focal liver abnormality is seen. No gallstones, gallbladder wall thickening, or biliary dilatation. Pancreas: Within normal limits. Spleen: Within normal limits. Adrenals/Urinary Tract: Adrenal glands are unremarkable. Kidneys are normal, without renal calculi, solid enhancing lesion, or hydronephrosis. Bladder is unremarkable. Stomach/Bowel: Small hiatal hernia otherwise the stomach is unremarkable for degree of distension. No pathologic dilation of small bowel. The appendix  and terminal ileum appear normal. Left-sided colonic diverticulosis mild short segment sigmoid colonic wall thickening and adjacent inflammation. Vascular/Lymphatic: No abdominal aortic aneurysm. No pathologically enlarged abdominal or pelvic lymph nodes. Reproductive: Prostate is unremarkable. Other: No abdominopelvic ascites. No walled off fluid collections. No pneumoperitoneum. Musculoskeletal: L5-S1 discogenic disease. No acute osseous abnormality. IMPRESSION: Mild acute uncomplicated sigmoid diverticulitis. Electronically Signed   By: JDahlia BailiffMD   On: 06/23/2021 17:54    Procedures Procedures   Medications Ordered in ED Medications  iohexol (OMNIPAQUE) 350 MG/ML injection 80 mL (80 mLs Intravenous Contrast Given 06/23/21 1719)  amoxicillin-clavulanate (AUGMENTIN) 875-125 MG per tablet 1 tablet (1 tablet Oral Given 06/23/21 1910)    ED Course  I have reviewed the triage vital signs and the nursing notes.  Pertinent labs & imaging results that were available during my care of the patient were reviewed by  me and considered in my medical decision making (see chart for details).    MDM Rules/Calculators/A&P                           60 year old male with past med history as seen below presenting for complaints of abdominal pain.  Patient is alert and oriented x3, no acute distress, afebrile, stable vital signs.  Physical exam demonstrates no reproducible abdominal tenderness.  Negative Lloyd sign.  Patient denies any current pain.  No rashes.    Laboratory studies demonstrate stable liver profile, lipase, and renal function.  UA demonstrates no urinary tract infection.  No hematuria.  CT abdomen demonstrates mild acute uncomplicated sigmoid diverticulitis. Augmentin given in ED and sent to pharmacy. Findings discussed in detail with patient with recommendations for follow up with pcp if symptoms do not resolve. Patient agreeable to plan. All questions answered prior to discharge.   Final  Clinical Impression(s) / ED Diagnoses Final diagnoses:  Diverticulitis    Rx / DC Orders ED Discharge Orders          Ordered    amoxicillin-clavulanate (AUGMENTIN) 875-125 MG tablet  Every 12 hours        06/23/21 1839             Lianne Cure, DO [id number]

## 2021-06-23 NOTE — ED Provider Notes (Signed)
Emergency Medicine Provider Triage Evaluation Note  Christopher Pacheco , a 60 y.o. male  was evaluated in triage.  Pt complains of left lower abdominal pain with some associated lightheadedness, fevers, chills, near syncope.  Episode 4 days ago and then again episode yesterday persistent at this time..  Review of Systems  Positive: Left lower abdominal pain, chills, near syncope Negative: Dysuria, hematuria, melena, hematochezia, diarrhea  Physical Exam  BP 126/79 (BP Location: Left Arm)   Pulse 77   Temp 98.2 F (36.8 C) (Oral)   Resp 18   SpO2 100%  Gen:   Awake, no distress   Resp:  Normal effort  MSK:   Moves extremities without difficulty  Other:  Abdomen soft, nondistended, tender in the left upper and lower quadrants.  No CVAT  Medical Decision Making  Medically screening exam initiated at 2:04 PM.  Appropriate orders placed.  Christopher Pacheco was informed that the remainder of the evaluation will be completed by another provider, this initial triage assessment does not replace that evaluation, and the importance of remaining in the ED until their evaluation is complete.  This chart was dictated using voice recognition software, Dragon. Despite the best efforts of this provider to proofread and correct errors, errors may still occur which can change documentation meaning.    Emeline Darling, PA-C [phone]    Lianne Cure, DO [phone]

## 2022-02-19 ENCOUNTER — Telehealth: Payer: Self-pay | Admitting: *Deleted

## 2022-02-19 NOTE — Telephone Encounter (Signed)
RETURNED PATIENT'S PHONE CALL, SPOKE WITH PATIENT. ?

## 2022-02-26 ENCOUNTER — Ambulatory Visit: Payer: Self-pay | Admitting: Radiation Oncology

## 2022-03-15 ENCOUNTER — Telehealth: Payer: Self-pay | Admitting: *Deleted

## 2022-03-15 NOTE — Telephone Encounter (Signed)
RETURNED PATIENT'S PHONE CALL, SPOKE WITH PATIENT. ?

## 2022-03-19 ENCOUNTER — Ambulatory Visit: Payer: BC Managed Care – PPO | Admitting: Radiation Oncology

## 2022-04-15 ENCOUNTER — Telehealth: Payer: Self-pay | Admitting: *Deleted

## 2022-04-15 NOTE — Telephone Encounter (Signed)
RETURNED PATIENT'S PHONE CALL, SPOKE WITH PATIENT. ?

## 2022-04-16 ENCOUNTER — Other Ambulatory Visit: Payer: Self-pay

## 2022-04-16 ENCOUNTER — Encounter: Payer: Self-pay | Admitting: Radiation Oncology

## 2022-04-16 ENCOUNTER — Ambulatory Visit
Admission: RE | Admit: 2022-04-16 | Discharge: 2022-04-16 | Disposition: A | Discharge status: Home / Self Care | Payer: BC Managed Care – PPO | Source: Ambulatory Visit | Attending: Radiation Oncology | Admitting: Radiation Oncology

## 2022-04-16 VITALS — BP 111/74 | HR 72 | Temp 97.2°F | Resp 18 | Ht 70.0 in | Wt 159.1 lb

## 2022-04-16 DIAGNOSIS — Z923 Personal history of irradiation: Secondary | ICD-10-CM | POA: Insufficient documentation

## 2022-04-16 DIAGNOSIS — R682 Dry mouth, unspecified: Secondary | ICD-10-CM | POA: Insufficient documentation

## 2022-04-16 DIAGNOSIS — C07 Malignant neoplasm of parotid gland: Secondary | ICD-10-CM

## 2022-04-16 DIAGNOSIS — Z8589 Personal history of malignant neoplasm of other organs and systems: Secondary | ICD-10-CM | POA: Insufficient documentation

## 2022-04-16 NOTE — Progress Notes (Signed)
Christopher Pacheco presents for follow up of radiation completed on 03/05/2019 to his left parotid area/ head and neck area.   Pain issues, if any: Patient denies Using a feeding tube?: N/A Weight changes, if any:  Wt Readings from Last 3 Encounters:  04/16/22 159 lb 2 oz (72.2 kg)  02/20/21 162 lb (73.5 kg)  06/20/20 158 lb 6 oz (71.8 kg)   Swallowing issues, if any: Patient denies--reports he can eat/drink a wide variety. Smoking or chewing tobacco? None Using fluoride trays daily? Yes--continues F/U with is community dentist every 6 months Last ENT visit was on: Not since Dr. Melony Overly retired  Other notable issues, if any: Continues to experience  occasional spasms down the left side of his neck (they are brief and tolerable). Denies any concerns for lymphedema. Continues to deal with dry mouth, but feels he's adapted and able to manage. Recently returned from a 10-day trip to [place] with his daughter. Overall reports he's doing well and feels good

## 2022-04-16 NOTE — Progress Notes (Signed)
Radiation Oncology         (336) [phone] ________________________________  Name: Christopher Pacheco MRN: [id number]  Date: 04/16/2022  DOB: 12-Jan-1961  Follow-Up Visit Note in person  CC: [name], MD  [name], *  Diagnosis and Prior Radiotherapy:       ICD-10-CM   1. Cancer of parotid gland [facility])  C07         Cancer Staging  Cancer of parotid gland [facility]) Staging form: Major Salivary Glands, AJCC 8th Edition - Pathologic stage from 01/09/2019: Stage II (pT2, pN0, cM0) - Signed by [name], MD on 01/09/2019 Laterality: Left Lymph-vascular invasion (LVI): LVI not present (absent)/not identified  Radiation Treatment Dates: 01/23/2019 through 03/05/2019 Site Technique Total Dose Dose per Fx Completed Fx Beam Energies  Head & neck: HN_Lt_parotid IMRT 60/60 2 30/30 6X   CHIEF COMPLAINT:  Here for follow-up and surveillance of parotid cancer  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   Pain issues, if any: Patient denies Using a feeding tube?: N/A Weight changes, if any:  Wt Readings from Last 3 Encounters:  04/16/22 159 lb 2 oz (72.2 kg)  02/20/21 162 lb (73.5 kg)  06/20/20 158 lb 6 oz (71.8 kg)   Swallowing issues, if any: Patient denies--reports he can eat/drink a wide variety. Smoking or chewing tobacco? None Using fluoride trays daily? Yes--continues F/U with is community dentist every 6 months Last ENT visit was on: Not since Dr. Melony Overly retired  Other notable issues, if any: Continues to experience  occasional spasms down the left side of his neck (they are brief and tolerable). Denies any concerns for lymphedema. Continues to deal with dry mouth, but feels he's adapted and able to manage. Recently returned from a 10-day trip to [place] with his daughter. Overall reports he's doing well and feels good    ALLERGIES:  has No Known  Allergies.  Meds: Current Outpatient Medications  Medication Sig Dispense Refill   triamcinolone (NASACORT) 55 MCG/ACT AERO nasal inhaler Place 1 spray into the nose 2 (two) times daily as needed (allergies).     No current facility-administered medications for this encounter.    Physical Findings: The patient is in no acute distress. Patient is alert and oriented. Wt Readings from Last 3 Encounters:  04/16/22 159 lb 2 oz (72.2 kg)  02/20/21 162 lb (73.5 kg)  06/20/20 158 lb 6 oz (71.8 kg)    height is '5\' 10"'$  (1.778 m) and weight is 159 lb 2 oz (72.2 kg). His temporal temperature is 97.2 F (36.2 C) (abnormal). His blood pressure is 111/74 and his pulse is 72. His respiration is 18 and oxygen saturation is 100%. .    General: Alert and oriented, in no acute distress HEENT: Moist mucosa.  Head is normocephalic.  Extraocular movements are intact.   Neck: parotid regions/Neck are notable for no masses indicative of adenopathy.  Skin: Skin in treatment fields shows satisfactory healing with no concerning findings Heart RRR Chest CTAB Extremities: No cyanosis or edema. Lymphatics: see Neck Exam Psychiatric: Judgment and insight are intact. Affect is appropriate.   Lab Findings: Lab Results  Component Value Date   WBC 3.7 (L) 06/23/2021   HGB 13.3 06/23/2021   HCT 39.9 06/23/2021   MCV 92.1 06/23/2021   PLT 212 06/23/2021    No results found for: TSH  Radiographic Findings: No results found.  Impression/Plan:  1) Head and Neck Cancer Status: In remission, no evidence of disease - I consider him cured.  2) Nutritional Status: good, no issues  3) Dry mouth: managing well with altering diet, sips of liquids  4) followup: I will see him in 1 yr.  5). Thyroid: His thyroid did not get significant exposure to RT, but it's reasonable for him to check TSHs annually as hypothyroidism is not rare. He states he will do so.  On date of service, in total, I spent 25 minutes  on this encounter. Patient was seen in person.  _____________________________________   [name], MD

## 2023-01-17 ENCOUNTER — Telehealth: Payer: Self-pay | Admitting: *Deleted

## 2023-01-17 NOTE — Telephone Encounter (Signed)
Called patient to inform that fu appt. has been moved to 05-03-23 @ 2 pm due to Dr. Isidore Moos not being in the office, unable to leave message due to vm not being set up

## 2023-04-22 ENCOUNTER — Ambulatory Visit: Payer: Self-pay | Admitting: Radiation Oncology

## 2023-05-03 ENCOUNTER — Ambulatory Visit: Payer: BC Managed Care – PPO | Admitting: Radiation Oncology

## 2023-05-05 ENCOUNTER — Telehealth: Payer: Self-pay | Admitting: *Deleted

## 2023-05-05 NOTE — Telephone Encounter (Signed)
Returned patient's phone call, spoke with patient 

## 2023-05-05 NOTE — Progress Notes (Signed)
Christopher Pacheco presents for follow up of radiation completed on 03/05/2019 to his left parotid area/ head and neck area.    Pain issues, if any: no pain to report Using a feeding tube?: never had feeding tube Weight changes, if any:  Wt Readings from Last 3 Encounters:  05/06/23 161 lb 6 oz (73.2 kg)  04/16/22 159 lb 2 oz (72.2 kg)  02/20/21 162 lb (73.5 kg)   Swallowing issues, if any: normal swallowing, dry mouth remains, adapted by drinking more water Smoking or chewing tobacco? Does not use Using fluoride toothpaste daily? None, uses fluoride at home in evening Last ENT visit was on: Three years ago. Last saw Dr. Ezzard Standing.  Other notable issues, if any: None to report.   Vitals:   05/06/23 1004  BP: 121/68  Pulse: 73  Resp: 18  Temp: (!) 97.3 F (36.3 C)  SpO2: 100%

## 2023-05-06 ENCOUNTER — Other Ambulatory Visit: Payer: Self-pay

## 2023-05-06 ENCOUNTER — Encounter: Payer: Self-pay | Admitting: Radiation Oncology

## 2023-05-06 ENCOUNTER — Ambulatory Visit
Admission: RE | Admit: 2023-05-06 | Discharge: 2023-05-06 | Disposition: A | Discharge status: Home / Self Care | Payer: BC Managed Care – PPO | Source: Ambulatory Visit | Attending: Radiation Oncology | Admitting: Radiation Oncology

## 2023-05-06 VITALS — BP 121/68 | HR 73 | Temp 97.3°F | Resp 18 | Ht 70.0 in | Wt 161.4 lb

## 2023-05-06 DIAGNOSIS — C07 Malignant neoplasm of parotid gland: Secondary | ICD-10-CM

## 2023-05-06 DIAGNOSIS — R682 Dry mouth, unspecified: Secondary | ICD-10-CM | POA: Insufficient documentation

## 2023-05-06 DIAGNOSIS — Z923 Personal history of irradiation: Secondary | ICD-10-CM

## 2023-05-06 DIAGNOSIS — Z8589 Personal history of malignant neoplasm of other organs and systems: Secondary | ICD-10-CM | POA: Diagnosis not present

## 2023-05-06 DIAGNOSIS — K118 Other diseases of salivary glands: Secondary | ICD-10-CM

## 2023-05-09 ENCOUNTER — Encounter: Payer: Self-pay | Admitting: Radiation Oncology

## 2023-05-09 NOTE — Progress Notes (Signed)
Radiation Oncology         [phone] ________________________________  Name: Christopher Pacheco MRN: [id number]  Date: 05/06/2023  DOB: [birth year]  Follow-Up Visit Note in person  CC: Daisy Floro, MD  Drema Halon, *  Diagnosis and Prior Radiotherapy:       ICD-10-CM   1. Cancer of parotid gland (HCC)  C07 Amb Referral to Survivorship Program    US Carotid Bilateral    2. Parotid mass  K11.8     3. History of radiation therapy  Z92.3 US Carotid Bilateral        Cancer Staging  Cancer of parotid gland [facility]) Staging form: Major Salivary Glands, AJCC 8th Edition - Pathologic stage from 01/09/2019: Stage II (pT2, pN0, cM0) - Signed by Lonie Peak, MD on 01/09/2019 Laterality: Left Lymph-vascular invasion (LVI): LVI not present (absent)/not identified  Radiation Treatment Dates: 01/23/2019 through 03/05/2019 Site Technique Total Dose Dose per Fx Completed Fx Beam Energies  Head & neck: HN_Lt_parotid IMRT 60/60 2 30/30 6X   CHIEF COMPLAINT:  Here for follow-up and surveillance of parotid cancer   NARRATIVE: 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   He is doing very well.  He and his wife continue to travel.  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  Pain issues, if any: no pain to report Using a feeding tube?: never had feeding tube Weight changes, if any:  Wt Readings from Last 3 Encounters:  05/06/23 161 lb 6 oz (73.2 kg)  04/16/22 159 lb 2 oz (72.2 kg)  02/20/21 162 lb (73.5 kg)   Swallowing issues, if any: normal swallowing, dry mouth remains, adapted by drinking more water Smoking or chewing tobacco? Does not use Using fluoride toothpaste daily? None, uses fluoride at home in evening Last ENT visit was on: Three years ago. Last saw Dr. Ezzard Standing who has since retired.  Other notable issues, if any: None to report.    Vitals:   05/06/23 1004  BP: 121/68  Pulse: 73  Resp: 18  Temp: (!) 97.3 F (36.3 C)  SpO2: 100%      ALLERGIES:  has No Known Allergies.  Meds: Current Outpatient Medications  Medication Sig Dispense Refill   triamcinolone (NASACORT) 55 MCG/ACT AERO nasal inhaler Place 1 spray into the nose 2 (two) times daily as needed (allergies).     No current facility-administered medications for this encounter.    Physical Findings: The patient is in no acute distress. Patient is alert and oriented. Wt Readings from Last 3 Encounters:  05/06/23 161 lb 6 oz (73.2 kg)  04/16/22 159 lb 2 oz (72.2 kg)  02/20/21 162 lb (73.5 kg)    height is 5\' 10"  (1.778 m) and weight is 161 lb 6 oz (73.2 kg). His temporal temperature is 97.3 F (36.3 C) (abnormal). His blood pressure is 121/68 and his pulse is 73. His respiration is 18 and oxygen saturation is 100%. .   General: Alert and oriented, in no acute distress HEENT: Moist mucosa.  Head is normocephalic.  Extraocular movements are intact.   Neck: parotid regions/Neck are notable for no masses indicative of adenopathy.  Skin: Skin in treatment fields shows satisfactory healing with no concerning findings Extremities: No cyanosis or edema. Lymphatics: see Neck Exam Neuro: nonfocal and without any obvious deficits Psychiatric: Judgment and insight are intact. Affect is appropriate.   Lab  Findings: Lab Results  Component Value Date   WBC 3.7 (L) 06/23/2021   HGB 13.3 06/23/2021   HCT 39.9 06/23/2021   MCV 92.1 06/23/2021   PLT 212 06/23/2021    No results found for: "TSH"  Radiographic Findings: No results found.  Impression/Plan:   Left parotid POLYMORPHOUS ADENOCARCINOMA, Low grade.  1) Head and Neck Cancer Status: In remission, no evidence of disease - I consider him cured.  2) Nutritional Status: good, no issues  3) Dry mouth: managing well with altering diet, sips of liquids; sees dentist regularly and uses fluoride at  home in evening  4) Followup: We have made a referral to our head and neck survivorship program to be seen in 6 months.  I will see him back as needed and be available for any questions that may arise in survivorship clinic.  We discussed the option of getting a carotid Doppler ultrasound to screen for plaques or stenoses given his history of prior neck radiation.  While this was given on the left side, I think a bilateral carotid ultrasound would be reasonable for a comprehensive baseline.  He would like to go ahead and get this so we will have it proceed his appointment with survivorship.  The frequency of subsequent ultrasounds will depend on how his baseline looks.  5). Thyroid: His thyroid did not get significant exposure to RT, but it's reasonable for him to check TSHs annually as hypothyroidism is not rare. He states he will continue to do so with his PCP.   On date of service, in total, I spent 25 minutes on this encounter. Patient was seen in person. Note signed after encounter date; minutes pertain to date of service, only.   _____________________________________   Lonie Peak, MD

## 2023-08-01 ENCOUNTER — Inpatient Hospital Stay: Payer: BC Managed Care – PPO | Attending: Adult Health | Admitting: Adult Health

## 2023-08-01 ENCOUNTER — Encounter: Payer: Self-pay | Admitting: Adult Health

## 2023-08-01 VITALS — BP 120/74 | HR 63 | Temp 97.9°F | Resp 18 | Ht 66.0 in | Wt 163.6 lb

## 2023-08-01 DIAGNOSIS — Z9189 Other specified personal risk factors, not elsewhere classified: Secondary | ICD-10-CM | POA: Insufficient documentation

## 2023-08-01 DIAGNOSIS — Z923 Personal history of irradiation: Secondary | ICD-10-CM | POA: Diagnosis not present

## 2023-08-01 DIAGNOSIS — R1310 Dysphagia, unspecified: Secondary | ICD-10-CM | POA: Diagnosis not present

## 2023-08-01 DIAGNOSIS — Z85858 Personal history of malignant neoplasm of other endocrine glands: Secondary | ICD-10-CM | POA: Diagnosis present

## 2023-08-01 DIAGNOSIS — C07 Malignant neoplasm of parotid gland: Secondary | ICD-10-CM

## 2023-08-01 NOTE — Progress Notes (Signed)
CLINIC:  Survivorship  REASON FOR VISIT:  Routine follow-up for history of head & neck cancer.  BRIEF ONCOLOGIC HISTORY:  Oncology History  Cancer of parotid gland (HCC)  12/01/2018 Surgery   Left parotidectomy with facial nerve dissection: polymorphous adenocarcinoma, 3.2 cm, margins negative, 1 LN negative, T2, N0   12/21/2018 Imaging   CT scans of the head, chest, and neck performed on 12/21/2018 revealing small nonspecific pulmonary nodules bilaterally measuring 3 mm or smaller and left level 2 lymph nodes with uncertain significance.    01/09/2019 Initial Diagnosis   Cancer of parotid gland (HCC)   01/09/2019 Cancer Staging   Staging form: Major Salivary Glands, AJCC 8th Edition - Pathologic stage from 01/09/2019: Stage II (pT2, pN0, cM0) - Signed by Lonie Peak, MD on 01/09/2019   01/23/2019 - 03/05/2019 Radiation Therapy   Site Technique Total Dose Dose per Fx Completed Fx Beam Energies  Head & neck: HN_Lt_parotid IMRT 60/60 2 30/30 6X     11/01/2019 Imaging   CT neck and chest negative for recurrence.      INTERVAL HISTORY:  He is here today for follow-up accompanied by his wife.  He is doing well today.  His weight has remained stable at 163 pounds.  He notes only dysphagia that occurs in his esophagus after swallowing if he does not drink water when eating.  He has his thyroid checked annually with his primary care provider.  Since Dr. Ezzard Standing retired he has undergone direct laryngoscopy with Dr. Basilio Cairo annually.  He is otherwise doing well.  ADDITIONAL REVIEW OF SYSTEMS:  Review of Systems  Constitutional:  Negative for appetite change, chills, fatigue, fever and unexpected weight change.  HENT:   Negative for hearing loss, lump/mass and trouble swallowing.   Eyes:  Negative for eye problems and icterus.  Respiratory:  Negative for chest tightness, cough and shortness of breath.   Cardiovascular:  Negative for chest pain, leg swelling and palpitations.   Gastrointestinal:  Negative for abdominal distention, abdominal pain, constipation, diarrhea, nausea and vomiting.  Endocrine: Negative for hot flashes.  Genitourinary:  Negative for difficulty urinating.   Musculoskeletal:  Negative for arthralgias.  Skin:  Negative for itching and rash.  Neurological:  Negative for dizziness, extremity weakness, headaches and numbness.  Hematological:  Negative for adenopathy. Does not bruise/bleed easily.  Psychiatric/Behavioral:  Negative for depression. The patient is not nervous/anxious.      CURRENT MEDICATIONS:  Current Outpatient Medications on File Prior to Visit  Medication Sig Dispense Refill   triamcinolone (NASACORT) 55 MCG/ACT AERO nasal inhaler Place 1 spray into the nose 2 (two) times daily as needed (allergies).     No current facility-administered medications on file prior to visit.    ALLERGIES:  No Known Allergies   PHYSICAL EXAM:  Vitals:   08/01/23 1513  BP: 120/74  Pulse: 63  Resp: 18  Temp: 97.9 F (36.6 C)   Filed Weights   08/01/23 1513  Weight: 163 lb 9.6 oz (74.2 kg)     General: Well-nourished, well-appearing male in no acute distress.  Accompanied/Unaccompanied today.  HEENT: Head is atraumatic and normocephalic.  Pupils equal and reactive to light. Conjunctivae clear without exudate.  Sclerae anicteric. Oral mucosa is pink and moist without lesions.  Tongue pink, moist, and midline. Oropharynx is pink and moist, without lesions. Lymph: No preauricular, postauricular, cervical, supraclavicular, or infraclavicular lymphadenopathy noted on palpation.   Neck: No palpable masses.  Cardiovascular: Normal rate and rhythm. Respiratory: Clear to auscultation  bilaterally. Chest expansion symmetric without accessory muscle use; breathing non-labored.  GI: Abdomen soft and round. Non-tender, non-distended. Bowel sounds normoactive.  GU: Deferred.   Neuro: No focal deficits. Steady gait.   Psych: Normal mood and  affect for situation. Extremities: No edema.  Skin: Warm and dry.    LABORATORY DATA:  None at this visit.  DIAGNOSTIC IMAGING:  None at this visit.    ASSESSMENT & PLAN:  Mr. Baldelli is a pleasant 62 y.o. male with history of parotid cancer, diagnosed in February 2020;  treated with radiation that he completed in April 2020.  Patient presents to survivorship clinic today for routine follow-up after finishing treatment.   1. Cancer of the parotid gland:  Mr. Larmore is clinically without evidence of disease or recurrence on physical exam today.   Placed a referral for him to see ENT next year to establish care and follow-up on his head neck cancer.  2. Nutritional status: His weight is stable.  He is eating a regular diet with sips of water without any difficulty.  He has no signs of lymphedema or dysphagia.   3.  At risk for hypothyroidism: He has this checked with Dr. Tenny Craw annually.  I recommended he continue with this.  4. At risk for tooth decay/dental concerns: He uses fluoride regularly and follows up with his dentist regularly.  He will continue to do this.  5. Health maintenance and wellness promotion: Cancer patients who consume a diet rich in fruits and vegetables have better overall health and decreased risk of cancer recurrence. Mr. Zeiner was encouraged to consume 5-7 servings of fruits and vegetables per day, as tolerated. Mr. Mcpartlin was also encouraged to engage in moderate to vigorous exercise for 30 minutes per day most days of the week.   6. Support services/counseling:   Mr. Haskill was encouraged to take advantage of our many other support services programs, support groups, and/or counseling in coping with his new life as a cancer survivor after completing anti-cancer treatment.    Dispo:  -Referral made to Dr. Irene Pap at [facility] in one year for LTS f/u with me    A total of 30 minutes was spent in the face-to-face care of this patient, with greater  than 50% of that time spent in counseling and care-coordination.   Lillard Anes, NP 08/01/23 3:45 PM Medical Oncology and Hematology [facility] [address] [place], [place] [address] Tel. [phone]    Fax. [phone]

## 2023-08-02 ENCOUNTER — Telehealth (INDEPENDENT_AMBULATORY_CARE_PROVIDER_SITE_OTHER): Payer: Self-pay | Admitting: Physician Assistant

## 2023-08-02 NOTE — Telephone Encounter (Signed)
We received referral that stated: Cancer of parotid gland (HCC). Please advise

## 2023-11-08 ENCOUNTER — Telehealth: Payer: Self-pay | Admitting: *Deleted

## 2023-11-08 ENCOUNTER — Other Ambulatory Visit: Payer: Self-pay

## 2023-11-08 DIAGNOSIS — C07 Malignant neoplasm of parotid gland: Secondary | ICD-10-CM

## 2023-11-08 NOTE — Telephone Encounter (Signed)
Called patient to inform of US Carotid Bilateral Scan for 11-10-23- arrival time- 10:45 am @ [facility], no restrictions to scan, lvm for a return call

## 2023-11-10 ENCOUNTER — Ambulatory Visit (HOSPITAL_COMMUNITY): Admission: RE | Admit: 2023-11-10 | Payer: BC Managed Care – PPO | Source: Ambulatory Visit

## 2024-03-20 ENCOUNTER — Other Ambulatory Visit (HOSPITAL_COMMUNITY): Payer: Self-pay | Admitting: Family Medicine

## 2024-03-20 ENCOUNTER — Encounter: Payer: Self-pay | Admitting: Family Medicine

## 2024-03-20 DIAGNOSIS — Z0000 Encounter for general adult medical examination without abnormal findings: Secondary | ICD-10-CM

## 2024-05-24 ENCOUNTER — Ambulatory Visit (HOSPITAL_COMMUNITY)
Admission: RE | Admit: 2024-05-24 | Discharge: 2024-05-24 | Disposition: A | Discharge status: Home / Self Care | Payer: Self-pay | Source: Ambulatory Visit | Attending: Family Medicine | Admitting: Family Medicine

## 2024-05-24 DIAGNOSIS — Z0000 Encounter for general adult medical examination without abnormal findings: Secondary | ICD-10-CM | POA: Insufficient documentation

## 2024-07-24 ENCOUNTER — Telehealth (INDEPENDENT_AMBULATORY_CARE_PROVIDER_SITE_OTHER): Payer: Self-pay | Admitting: Otolaryngology

## 2024-07-24 NOTE — Telephone Encounter (Signed)
 Called to reschedule appt on 9/22 due to Dr.Soldatova being in surgery, vm not set up

## 2024-07-30 ENCOUNTER — Telehealth: Payer: Self-pay | Admitting: *Deleted

## 2024-07-30 ENCOUNTER — Inpatient Hospital Stay: Payer: Self-pay | Admitting: Adult Health

## 2024-07-30 NOTE — Telephone Encounter (Signed)
 Pt called stating that he was not aware of f/u appt today with Morna, NP.   Stated he could not make appt today;  pt stated he did not find last visit  -  LTS was not helpful at all -  per pt. Offered pt if he would like to have phone visit with NP, or  reschedule appt to a later date -  pt declined. Pt stated if NP feels there is an updated on pt's health, then it is ok to reschedule ; otherwise, pt would not reschedule.

## 2024-07-30 NOTE — Telephone Encounter (Signed)
 Attempted to call pt to remind him of appt with Dr. Soldatova for  08/06/24.   No answers.   Unable to leave message due to voice mail  not set up yet.

## 2024-07-30 NOTE — Telephone Encounter (Signed)
 Dr. Izell, RICK.  Thu, please make sure he keeps his f/u with Dr. Soldotova.    Warmly, Morna

## 2024-07-31 NOTE — Progress Notes (Signed)
 Oncology Nurse Navigator Documentation   I called [name] phone to remind him of his upcoming appointment with Dr. Soldatova on 08/20/24 which had been rescheduled from 08/06/24. He did not answer my call and his voicemail is not set up. I will attempt to call him again before 08/20/24 to hopefully inform him of his appointment.   Christopher Jefferson RN, BSN, OCN Head & Neck Oncology Nurse Navigator [facility] at [facility] Phone # [phone]  Fax # [phone]

## 2024-08-06 ENCOUNTER — Institutional Professional Consult (permissible substitution) (INDEPENDENT_AMBULATORY_CARE_PROVIDER_SITE_OTHER): Payer: BC Managed Care – PPO | Admitting: Otolaryngology

## 2024-08-20 ENCOUNTER — Institutional Professional Consult (permissible substitution) (INDEPENDENT_AMBULATORY_CARE_PROVIDER_SITE_OTHER): Admitting: Otolaryngology

## 2024-08-27 NOTE — Progress Notes (Signed)
 Oncology Nurse Navigator Documentation   I attempted to call Mr. Yuan to remind him of his appointment with Dr. Okey on 09/05/24. The phone rang but I was unable to leave a VM due to his VM not being set up yet. I will try again this week to reach Mr. Reznik.   Delon Jefferson RN, BSN, OCN Head & Neck Oncology Nurse Navigator [facility] at [facility] Phone # [phone]  Fax # [phone]

## 2024-09-05 ENCOUNTER — Institutional Professional Consult (permissible substitution) (INDEPENDENT_AMBULATORY_CARE_PROVIDER_SITE_OTHER): Admitting: Otolaryngology

## 2024-09-10 ENCOUNTER — Encounter (INDEPENDENT_AMBULATORY_CARE_PROVIDER_SITE_OTHER): Payer: Self-pay | Admitting: Otolaryngology

## 2024-09-10 ENCOUNTER — Ambulatory Visit (INDEPENDENT_AMBULATORY_CARE_PROVIDER_SITE_OTHER): Admitting: Otolaryngology

## 2024-09-10 VITALS — BP 129/81 | HR 71 | Ht 66.0 in | Wt 153.0 lb

## 2024-09-10 DIAGNOSIS — C07 Malignant neoplasm of parotid gland: Secondary | ICD-10-CM

## 2024-09-10 NOTE — Progress Notes (Signed)
 Dear Dr. Crawford, Here is my assessment for our mutual patient, Christopher Pacheco. Thank you for allowing me the opportunity to care for your patient. Please do not hesitate to contact me should you have any other questions. Sincerely, Dr. Eldora Blanch  Otolaryngology Clinic Note Referring provider: Dr. Crawford HPI:  Harvin Konicek is a 63 y.o. male kindly referred by Dr. Crawford for evaluation of left parotid carcinoma surveillance  Initial visit (08/2024):  He reports that he is doing very well in terms of parotid cancer. He reports that his mouth is dry somewhat. No facial weakness, ear pain, no weight loss.  Patient otherwise denies: - odynophagia, shortness of breath, hemoptysis  - No neck masses  H&N Surgery: Parotidectomy (2020) Personal or FHx of bleeding dz or anesthesia difficulty: no  AP/AC: no  Tobacco: no  Independent Review of Additional Tests or Records:  Morna Crawford (08/01/2023): noted polymorphous adenocarcinoma, T2N0, s/p resection; underwent RT 02/2019.Otherwise mild dysphagia if does not drink water. Dx: Parotid Cancer, rec ENT follow up.  Dr. Izell (04/2023): noted parotid cancer, treated and now in remission, NED; consider cured Dr. Ethyl (01/01/2021): no issues; facial nerve intact; NED Path 12/01/2018: low grade adenocarcinoma of parotid gland, 0/1 LN pos, -LVI, -PNI CT Neck 11/01/2019 interpreted: no noted parotid mass identified; no worrisome adenopathy appreciated PMH/Meds/All/SocHx/FamHx/ROS:   Past Medical History:  Diagnosis Date   Allergy    Diffuse infection of pancreas    30 years ago   Diverticulitis    Fatty liver    Dr. Rosalie   History of radiation therapy 01/23/19- 03/05/19   Left Parotid/ head and neck radiation, 30 fractions of 2 Gy each for total of 60 Gy.    Medical history non-contributory      Past Surgical History:  Procedure Laterality Date   COLONOSCOPY     PAROTIDECTOMY Left 12/01/2018   Procedure: PAROTIDECTOMY;  Surgeon:  Ethyl Lonni BRAVO, MD;  Location:  SURGERY CENTER;  Service: ENT;  Laterality: Left;    History reviewed. No pertinent family history.   Social Connections: Not on file      Current Outpatient Medications:    celecoxib (CELEBREX) 200 MG capsule, 1 capsule as needed Orally twice a day; Duration: 30 days, Disp: , Rfl:    ciprofloxacin-dexamethasone  (CIPRODEX) OTIC suspension, , Disp: , Rfl:    clobetasol (OLUX) 0.05 % topical foam, 1 Application., Disp: , Rfl:    ibuprofen  (ADVIL ) 200 MG tablet, 1 tablet with food or milk as needed Orally Three times a day, Disp: , Rfl:    methocarbamol (ROBAXIN) 500 MG tablet, 1 tablets Orally every 4 hrs; Duration: 15 days, Disp: , Rfl:    triamcinolone (NASACORT) 55 MCG/ACT AERO nasal inhaler, Place 1 spray into the nose 2 (two) times daily as needed (allergies)., Disp: , Rfl:    Physical Exam:   BP 129/81 (BP Location: Right Arm, Patient Position: Sitting, Cuff Size: Large)   Pulse 71   Ht 5' 6 (1.676 m)   Wt 153 lb (69.4 kg)   SpO2 99%   BMI 24.69 kg/m   Salient findings:  CN II-XII intact - HB 1/6 b/l CN 7 Bilateral EAC clear and TM intact with well pneumatized middle ear spaces; left mildly dry Anterior rhinoscopy: Septum intact; bilateral inferior turbinates without significant hypertrophy No lesions of oral cavity/oropharynx; dentition fair; some thick secretions suggestive of mild xerostomia No obviously palpable neck masses/lymphadenopathy/thyromegaly; parotidectomy incision well healed No respiratory distress or stridor Expected post-radiation changes left  face  Seprately Identifiable Procedures:  Prior to initiating any procedures, risks/benefits/alternatives were explained to the patient and verbal consent obtained. None  Impression & Plans:  Christopher Pacheco is a 63 y.o. male with:  1. Cancer of parotid gland (HCC)    pT2N0 (low grade) polymorphous adenocarcinoma s/p resection and post-op RT in 2020. -LVI,  -PNI. He is now over 5 years out. NED Managing his xerostomia Emphasized sun screen Can check TSH qyearly with PCP We discussed f/u, can do PRN but will check him back in 3 years per his preference   See below regarding exact medications prescribed this encounter including dosages and route: No orders of the defined types were placed in this encounter.     Thank you for allowing me the opportunity to care for your patient. Please do not hesitate to contact me should you have any other questions.  Sincerely, Eldora Blanch, MD Otolaryngologist (ENT), [facility] Phone: [phone] Fax: [phone]  09/10/2024, 11:43 AM   MDM:  I have personally spent 46 minutes involved in face-to-face and non-face-to-face activities for this patient on the day of the visit.  Professional time spent excludes any procedures performed but includes the following activities, in addition to those noted in the documentation: preparing to see the patient (review of outside documentation and results), performing a medically appropriate examination, counseling, documenting in the electronic health record, independently interpreting results (CT).
